# Patient Record
Sex: Female | Born: 1980 | Race: White | Hispanic: No | Marital: Married | Smoking: Never smoker
Health system: Southern US, Community
[De-identification: ages and names within clinical notes are randomized; demographics above are authoritative.]

## PROBLEM LIST (undated history)

## (undated) DIAGNOSIS — D649 Anemia, unspecified: Secondary | ICD-10-CM

## (undated) DIAGNOSIS — I82409 Acute embolism and thrombosis of unspecified deep veins of unspecified lower extremity: Secondary | ICD-10-CM

## (undated) DIAGNOSIS — D759 Disease of blood and blood-forming organs, unspecified: Secondary | ICD-10-CM

## (undated) DIAGNOSIS — D259 Leiomyoma of uterus, unspecified: Secondary | ICD-10-CM

## (undated) HISTORY — PX: ABDOMINAL HYSTERECTOMY: SHX81

## (undated) HISTORY — PX: MYOMECTOMY: SHX85

---

## 1998-08-14 ENCOUNTER — Other Ambulatory Visit: Admission: RE | Admit: 1998-08-14 | Discharge: 1998-08-14 | Payer: Self-pay | Admitting: Obstetrics and Gynecology

## 1999-12-25 ENCOUNTER — Other Ambulatory Visit: Admission: RE | Admit: 1999-12-25 | Discharge: 1999-12-25 | Payer: Self-pay | Admitting: *Deleted

## 2001-02-24 ENCOUNTER — Other Ambulatory Visit: Admission: RE | Admit: 2001-02-24 | Discharge: 2001-02-24 | Payer: Self-pay | Admitting: Obstetrics and Gynecology

## 2002-08-16 ENCOUNTER — Other Ambulatory Visit: Admission: RE | Admit: 2002-08-16 | Discharge: 2002-08-16 | Payer: Self-pay | Admitting: Obstetrics and Gynecology

## 2007-02-18 HISTORY — PX: UTERINE FIBROID SURGERY: SHX826

## 2007-06-24 ENCOUNTER — Observation Stay (HOSPITAL_COMMUNITY): Admission: AD | Admit: 2007-06-24 | Discharge: 2007-06-25 | Payer: Self-pay | Admitting: Obstetrics and Gynecology

## 2007-08-25 ENCOUNTER — Inpatient Hospital Stay (HOSPITAL_COMMUNITY): Admission: RE | Admit: 2007-08-25 | Discharge: 2007-08-27 | Payer: Self-pay | Admitting: *Deleted

## 2007-08-25 ENCOUNTER — Encounter (INDEPENDENT_AMBULATORY_CARE_PROVIDER_SITE_OTHER): Payer: Self-pay | Admitting: *Deleted

## 2010-03-10 ENCOUNTER — Encounter: Payer: Self-pay | Admitting: Obstetrics and Gynecology

## 2010-07-02 NOTE — Op Note (Signed)
NAMERUDI, KNIPPENBERG              ACCOUNT NO.:  [id number]   MEDICAL RECORD NO.:  [id number]          PATIENT TYPE:  INP   LOCATION:  9318                          FACILITY:  [facility]   PHYSICIAN:  Linden B. Earlene Plater, M.D.  DATE OF BIRTH:  1980/02/23   DATE OF PROCEDURE:  08/25/2007  DATE OF DISCHARGE:                               OPERATIVE REPORT   PREOPERATIVE DIAGNOSES:  1. Excessive menstrual bleeding.  2. Uterine fibroids.   POSTOPERATIVE DIAGNOSES:  1. Excessive menstrual bleeding.  2. Uterine fibroids.   PROCEDURE:  Abdominal myomectomy.   SURGEON:  Chester Holstein. Earlene Plater, MD   ASSISTANT:  Lendon Colonel, MD   ANESTHESIA:  General.   FINDINGS:  Fibroid uterus, 35 fibroids removed primarily intramural and  several submucosal, ranging from 1-5 cm in diameter.  Cumulative weight  of the fibroids was 813 g, normal-appearing tubes and ovaries.   SPECIMEN:  Fibroid as above to Pathology.   BLOOD LOSS:  1100 mL.   COMPLICATIONS:  None.   INDICATIONS:  The patient is with a history of extremely heavy menstrual  bleeding with associated anemia previously with a hemoglobin in the 4  range, having previously required transfusion.  She was given Lupron but  really did not have much improvement in terms of bleeding and presents  for myomectomy.  Preop MRI showed diffusely fibrotic uterus but no  concerning signs for sarcoma.  The patient was advised of the risks of  surgery, including infection, bleeding, and damage to the surrounding  organs.   PROCEDURE NOTE:  The patient was taken to the operating room.  General  anesthesia was obtained.  Prepped and draped in standard fashion.  Foley  catheter was inserted into the bladder.   A vertical incision was made from the umbilicus to the pubis and carried  sharply to the fascia.  The fascia was divided sharply.  Posterior  sheath and peritoneum were elevated and entered sharply.  Uterus was  elevated through the incision.  The findings  as outlined above.  Midline  in the fundal area was infiltrated with dilute Pitressin solution and  dissection was carried caudad towards the bladder flap but respecting  the bladder flap area.  The area was undermined and multiple fibroids  were removed as outlined above.   Several of the fibroids were submucosal and were removed with sharp and  blunt technique.  All dead spaces were reapproximated with interrupted 0  Vicryl sutures and the primary uterine incision was closed in layers  with 0 Vicryl.  Hemostasis was obtained.  Interceed was placed over the  primary incision.  The fascia was then closed with a running stitch of  double-stranded #1 PDS suture.  Skin was closed with staples.   Speculum was inserted and a 30-mL Foley was inserted into the uterine  cavity and inflated to hopefully reduce intrauterine synechia.  This  will be removed in several days later in the office.   The patient tolerated the procedure well with no complications.  She was  taken to recovery room in awake and stable condition.  Gerri Spore B. Earlene Plater, M.D.  Electronically Signed     WBD/MEDQ  D:  08/25/2007  T:  08/26/2007  Job:  [id number]

## 2010-07-05 NOTE — Discharge Summary (Signed)
[name], [name]              ACCOUNT NO.:  [id number]   MEDICAL RECORD NO.:  [id number]          PATIENT TYPE:  INP   LOCATION:  9318                          FACILITY:  [facility]   PHYSICIAN:  Lenoard Aden, M.D.DATE OF BIRTH:  [birth year]   DATE OF ADMISSION:  08/25/2007  DATE OF DISCHARGE:  08/27/2007                               DISCHARGE SUMMARY   ADMISSION DIAGNOSES:  Abnormal uterine bleeding and fibroids.   DISCHARGE DIAGNOSES:  Abnormal uterine bleeding and fibroids.   PROCEDURE:  Abdominal myomectomy and blood transfusion for anemia.   HISTORY OF PRESENT ILLNESS:  A 30 year old white female with a 20-week  fibroid uterus, associated anemia, with hemoglobin in the 4 range prior  to admission.  She was transfused and placed on hormonal management to  stop her bleeding in time for surgery.   HOSPITAL COURSE:  The patient underwent abdominal myomectomy of some 35  fibroids on the day of surgery with over 800 g of accumulative fibroid  weight.  She had substantial bleeding towards the end of the procedure  and had resultant anemia, which required additional transfusions  postoperatively, but otherwise, she did well.  She was discharged home  on the second postoperative day in satisfactory condition.   DISCHARGE INSTRUCTIONS:  Return to office and await postoperative staple  removal.  No heavy lifting for 6 weeks.  No driving for 2 weeks.   DISCHARGE MEDICATIONS:  1. Tylox 1-2 p.o. q.4h. p.r.n. pain.  2. Ferrous sulfate 325 mg p.o. b.i.d.   DISPOSITION AT DISCHARGE:  Satisfactory.      Lenoard Aden, M.D.  Electronically Signed     Lenoard Aden, M.D.  Electronically Signed    RJT/MEDQ  D:  09/17/2007  T:  09/18/2007  Job:  [id number]

## 2010-11-14 LAB — PREPARE RBC (CROSSMATCH)

## 2010-11-14 LAB — DIFFERENTIAL
Basophils Absolute: 0.1
Basophils Relative: 1
Eosinophils Relative: 3
Lymphocytes Relative: 38
Monocytes Absolute: 0.8

## 2010-11-14 LAB — CBC
HCT: 24.9 — ABNORMAL LOW
HCT: 26 — ABNORMAL LOW
HCT: 29 — ABNORMAL LOW
Hemoglobin: 5.3 — CL
Hemoglobin: 9.4 — ABNORMAL LOW
MCHC: 32.2
MCHC: 32.4
MCV: 90.4
MCV: 91.4
Platelets: 261
Platelets: 275
Platelets: 462 — ABNORMAL HIGH
RBC: 1.82 — ABNORMAL LOW
RDW: 15.8 — ABNORMAL HIGH
RDW: 15.8 — ABNORMAL HIGH
RDW: 16.2 — ABNORMAL HIGH
RDW: 16.7 — ABNORMAL HIGH
WBC: 10.8 — ABNORMAL HIGH
WBC: 12.2 — ABNORMAL HIGH

## 2010-11-14 LAB — TYPE AND SCREEN

## 2010-11-14 LAB — PREGNANCY, URINE: Preg Test, Ur: NEGATIVE

## 2015-01-03 ENCOUNTER — Other Ambulatory Visit (HOSPITAL_COMMUNITY): Payer: Self-pay | Admitting: Family Medicine

## 2015-01-03 ENCOUNTER — Ambulatory Visit (HOSPITAL_COMMUNITY)
Admission: RE | Admit: 2015-01-03 | Discharge: 2015-01-03 | Disposition: A | Discharge status: Home / Self Care | Payer: BLUE CROSS/BLUE SHIELD | Source: Ambulatory Visit | Attending: Family Medicine | Admitting: Family Medicine

## 2015-01-03 ENCOUNTER — Ambulatory Visit (HOSPITAL_COMMUNITY): Payer: Self-pay

## 2015-01-03 DIAGNOSIS — R52 Pain, unspecified: Secondary | ICD-10-CM

## 2015-01-03 DIAGNOSIS — M7989 Other specified soft tissue disorders: Secondary | ICD-10-CM | POA: Insufficient documentation

## 2015-01-03 NOTE — Progress Notes (Signed)
VASCULAR LAB PRELIMINARY  PRELIMINARY  PRELIMINARY  PRELIMINARY  Bilateral lower extremity venous duplex completed.    Preliminary report:  Bilateral:  No evidence of DVT, superficial thrombosis, or Baker's Cyst.  Sluggish flow noted bilaterally.  Veins are difficult to compress.  Cannot rule out proximal obstruction or extrinsic compression.    Aivah Putman, RVT 01/03/2015, 11:40 AM

## 2015-01-05 ENCOUNTER — Encounter (HOSPITAL_COMMUNITY): Payer: Self-pay

## 2015-01-05 ENCOUNTER — Emergency Department (HOSPITAL_COMMUNITY): Payer: BLUE CROSS/BLUE SHIELD

## 2015-01-05 ENCOUNTER — Inpatient Hospital Stay (HOSPITAL_COMMUNITY)
Admission: EM | Admit: 2015-01-05 | Discharge: 2015-01-07 | DRG: 301 | Disposition: A | Discharge status: Home / Self Care | Payer: BLUE CROSS/BLUE SHIELD | Attending: Internal Medicine | Admitting: Internal Medicine

## 2015-01-05 ENCOUNTER — Ambulatory Visit (HOSPITAL_BASED_OUTPATIENT_CLINIC_OR_DEPARTMENT_OTHER)
Admission: RE | Admit: 2015-01-05 | Discharge: 2015-01-05 | Disposition: A | Discharge status: Home / Self Care | Payer: BLUE CROSS/BLUE SHIELD | Source: Ambulatory Visit | Attending: Cardiology | Admitting: Cardiology

## 2015-01-05 ENCOUNTER — Other Ambulatory Visit (HOSPITAL_COMMUNITY): Payer: Self-pay | Admitting: Obstetrics

## 2015-01-05 DIAGNOSIS — I82411 Acute embolism and thrombosis of right femoral vein: Secondary | ICD-10-CM | POA: Diagnosis present

## 2015-01-05 DIAGNOSIS — I82431 Acute embolism and thrombosis of right popliteal vein: Secondary | ICD-10-CM | POA: Diagnosis present

## 2015-01-05 DIAGNOSIS — Z8049 Family history of malignant neoplasm of other genital organs: Secondary | ICD-10-CM

## 2015-01-05 DIAGNOSIS — I82811 Embolism and thrombosis of superficial veins of right lower extremities: Secondary | ICD-10-CM | POA: Diagnosis present

## 2015-01-05 DIAGNOSIS — Y92019 Unspecified place in single-family (private) house as the place of occurrence of the external cause: Secondary | ICD-10-CM

## 2015-01-05 DIAGNOSIS — R609 Edema, unspecified: Secondary | ICD-10-CM

## 2015-01-05 DIAGNOSIS — I82401 Acute embolism and thrombosis of unspecified deep veins of right lower extremity: Secondary | ICD-10-CM | POA: Diagnosis present

## 2015-01-05 DIAGNOSIS — F419 Anxiety disorder, unspecified: Secondary | ICD-10-CM | POA: Diagnosis present

## 2015-01-05 DIAGNOSIS — I82403 Acute embolism and thrombosis of unspecified deep veins of lower extremity, bilateral: Secondary | ICD-10-CM | POA: Diagnosis present

## 2015-01-05 DIAGNOSIS — D259 Leiomyoma of uterus, unspecified: Secondary | ICD-10-CM | POA: Diagnosis not present

## 2015-01-05 DIAGNOSIS — I82441 Acute embolism and thrombosis of right tibial vein: Principal | ICD-10-CM | POA: Diagnosis present

## 2015-01-05 DIAGNOSIS — I82409 Acute embolism and thrombosis of unspecified deep veins of unspecified lower extremity: Secondary | ICD-10-CM

## 2015-01-05 DIAGNOSIS — I82220 Acute embolism and thrombosis of inferior vena cava: Secondary | ICD-10-CM | POA: Diagnosis present

## 2015-01-05 DIAGNOSIS — T384X5A Adverse effect of oral contraceptives, initial encounter: Secondary | ICD-10-CM | POA: Diagnosis present

## 2015-01-05 HISTORY — DX: Leiomyoma of uterus, unspecified: D25.9

## 2015-01-05 LAB — CBC WITH DIFFERENTIAL/PLATELET
BASOS PCT: 0 %
Basophils Absolute: 0 10*3/uL (ref 0.0–0.1)
Eosinophils Absolute: 0.1 10*3/uL (ref 0.0–0.7)
Eosinophils Relative: 1 %
HEMATOCRIT: 40.8 % (ref 36.0–46.0)
HEMOGLOBIN: 13.5 g/dL (ref 12.0–15.0)
Lymphocytes Relative: 14 %
Lymphs Abs: 1.6 10*3/uL (ref 0.7–4.0)
MCH: 31.8 pg (ref 26.0–34.0)
MCHC: 33.1 g/dL (ref 30.0–36.0)
MCV: 96 fL (ref 78.0–100.0)
MONOS PCT: 9 %
Monocytes Absolute: 1 10*3/uL (ref 0.1–1.0)
NEUTROS ABS: 8.5 10*3/uL — AB (ref 1.7–7.7)
NEUTROS PCT: 76 %
Platelets: 247 10*3/uL (ref 150–400)
RBC: 4.25 MIL/uL (ref 3.87–5.11)
RDW: 13 % (ref 11.5–15.5)
WBC: 11.3 10*3/uL — ABNORMAL HIGH (ref 4.0–10.5)

## 2015-01-05 LAB — BASIC METABOLIC PANEL
ANION GAP: 12 (ref 5–15)
BUN: 6 mg/dL (ref 6–20)
CALCIUM: 9.3 mg/dL (ref 8.9–10.3)
CHLORIDE: 103 mmol/L (ref 101–111)
CO2: 21 mmol/L — AB (ref 22–32)
Creatinine, Ser: 0.67 mg/dL (ref 0.44–1.00)
GFR calc non Af Amer: >60 mL/min (ref >60)
Glucose, Bld: 110 mg/dL — ABNORMAL HIGH (ref 65–99)
Potassium: 3.6 mmol/L (ref 3.5–5.1)
Sodium: 136 mmol/L (ref 135–145)

## 2015-01-05 LAB — HEPARIN LEVEL (UNFRACTIONATED): Heparin Unfractionated: 0.13 IU/mL — ABNORMAL LOW (ref 0.30–0.70)

## 2015-01-05 MED ORDER — HEPARIN BOLUS VIA INFUSION
2000.0000 [IU] | Freq: Once | INTRAVENOUS | Status: AC
  Administered 2015-01-05: 2000 [IU] via INTRAVENOUS
  Filled 2015-01-05: qty 2000

## 2015-01-05 MED ORDER — ALUM & MAG HYDROXIDE-SIMETH 200-200-20 MG/5ML PO SUSP: 30.0000 mL | Freq: Four times a day (QID) | ORAL | Status: DC | PRN

## 2015-01-05 MED ORDER — ONDANSETRON HCL 4 MG PO TABS: 4.0000 mg | ORAL_TABLET | Freq: Four times a day (QID) | ORAL | Status: DC | PRN

## 2015-01-05 MED ORDER — ACETAMINOPHEN 650 MG RE SUPP: 650.0000 mg | Freq: Four times a day (QID) | RECTAL | Status: DC | PRN

## 2015-01-05 MED ORDER — HYDROCODONE-ACETAMINOPHEN 5-325 MG PO TABS
1.0000 | ORAL_TABLET | ORAL | Status: DC | PRN
  Administered 2015-01-06 (×2): 1 via ORAL
  Administered 2015-01-07: 2 via ORAL
  Filled 2015-01-05: qty 2
  Filled 2015-01-05 (×2): qty 1

## 2015-01-05 MED ORDER — HEPARIN (PORCINE) IN NACL 100-0.45 UNIT/ML-% IJ SOLN
1250.0000 [IU]/h | INTRAMUSCULAR | Status: DC
  Administered 2015-01-05: 1250 [IU]/h via INTRAVENOUS
  Filled 2015-01-05: qty 250

## 2015-01-05 MED ORDER — SODIUM CHLORIDE 0.9 % IV BOLUS (SEPSIS)
1000.0000 mL | Freq: Once | INTRAVENOUS | Status: AC
  Administered 2015-01-05: 1000 mL via INTRAVENOUS

## 2015-01-05 MED ORDER — SODIUM CHLORIDE 0.9 % IV SOLN: 250.0000 mL | INTRAVENOUS | Status: DC | PRN

## 2015-01-05 MED ORDER — SODIUM CHLORIDE 0.9 % IJ SOLN
3.0000 mL | Freq: Two times a day (BID) | INTRAMUSCULAR | Status: DC
  Administered 2015-01-06 – 2015-01-07 (×2): 3 mL via INTRAVENOUS

## 2015-01-05 MED ORDER — HEPARIN BOLUS VIA INFUSION
5000.0000 [IU] | Freq: Once | INTRAVENOUS | Status: AC
  Administered 2015-01-05: 5000 [IU] via INTRAVENOUS
  Filled 2015-01-05: qty 5000

## 2015-01-05 MED ORDER — HEPARIN (PORCINE) IN NACL 100-0.45 UNIT/ML-% IJ SOLN
1800.0000 [IU]/h | INTRAMUSCULAR | Status: DC
  Administered 2015-01-06: 1750 [IU]/h via INTRAVENOUS
  Filled 2015-01-05 (×4): qty 250

## 2015-01-05 MED ORDER — SODIUM CHLORIDE 0.9 % IV BOLUS (SEPSIS)
500.0000 mL | Freq: Once | INTRAVENOUS | Status: AC
  Administered 2015-01-05: 500 mL via INTRAVENOUS

## 2015-01-05 MED ORDER — SODIUM CHLORIDE 0.9 % IJ SOLN: 3.0000 mL | INTRAMUSCULAR | Status: DC | PRN

## 2015-01-05 MED ORDER — IOHEXOL 350 MG/ML SOLN
80.0000 mL | Freq: Once | INTRAVENOUS | Status: AC | PRN
  Administered 2015-01-05: 80 mL via INTRAVENOUS

## 2015-01-05 MED ORDER — ONDANSETRON HCL 4 MG/2ML IJ SOLN: 4.0000 mg | Freq: Four times a day (QID) | INTRAMUSCULAR | Status: DC | PRN

## 2015-01-05 MED ORDER — ZOLPIDEM TARTRATE 5 MG PO TABS
5.0000 mg | ORAL_TABLET | Freq: Every evening | ORAL | Status: DC | PRN
  Administered 2015-01-05 – 2015-01-06 (×2): 5 mg via ORAL
  Filled 2015-01-05 (×2): qty 1

## 2015-01-05 MED ORDER — ACETAMINOPHEN 325 MG PO TABS: 650.0000 mg | ORAL_TABLET | Freq: Four times a day (QID) | ORAL | Status: DC | PRN

## 2015-01-05 NOTE — Progress Notes (Signed)
ANTICOAGULATION CONSULT NOTE - Follow-Up Consult  Pharmacy Consult for heparin Indication: DVT  No Known Allergies  Patient Measurements: Height: 5\' 5"  (165.1 cm) Weight: 175 lb (79.379 kg) IBW/kg (Calculated) : 57 Heparin Dosing Weight: 73.7 kg  Vital Signs: Temp: 98.2 F (36.8 C) (11/18 2045) Temp Source: Oral (11/18 2045) BP: 132/85 mmHg (11/18 2045) Pulse Rate: 120 (11/18 2045)  Labs:  Recent Labs  01/05/15 1204 01/05/15 2159  HGB 13.5  --   HCT 40.8  --   PLT 247  --   HEPARINUNFRC  --  0.13*  CREATININE 0.67  --     Estimated Creatinine Clearance: 101.3 mL/min (by C-G formula based on Cr of 0.67).   Medical History: Past Medical History  Diagnosis Date  . Fibroid, uterine    Assessment: 34 yo f presenting to the ED on 11/18 with BLE edema and pain.  Pharmacy is consulted to dose heparin for a DVT. Patient is not on any anticoagulation at home. Hgb 13.5, plts 247.   Initial heparin level below   Goal of Therapy:  Heparin level 0.3-0.7 units/ml Monitor platelets by anticoagulation protocol: Yes   Plan: Give heparin 2000 units IV bolus x 1. Then increase heparin gtt to 1450 units/hr. Check heparin level 6 hrs after gtt increased. Daily HL, CBC Monitor for s/sx of bleeding  Uvaldo Rising, BCPS  Clinical Pharmacist Pager [phone]  01/05/2015 10:46 PM

## 2015-01-05 NOTE — H&P (Addendum)
[facility] History and Physical  [name] [id number] DOB: July 23, 1980 DOA: 01/05/2015   PCP: Stephens Shire., MD    Chief Complaint: swelling in leg  HPI: [name] is a 34 y.o. female with h/o fibroid uterus s/p myomectomy who is on oral contraceptives. She began to have swelling in both legs earlier this week and underwent a venous duplex which was negative. She was placed on HCTZ and has taken 1 tab thus far. She subsequently developed severe swelling of her right leg yesterday and therefore underwent another duplex which reveals an extensive DVT. She drives an hour each way to work daily and sits at a desk all day. No other h/o long car or plane rides recently. She has no pain in the leg and only feels pressure when she ambulates. CT chest is negative for PE.  She has been evaluated by IR and it has been recommended by Dr Jacqualyn Posey that she undergo a thrombectomy tomorrow. She is, therefore, being admitted to the hospitalist service.  In regards to her uterine fibroids, she has undergone a myomectomy but still requires OCP to prevent heavy menses. She was told to transition from Minastrin to Norethindrone by Dr Pamala Hurry [facility]) today with plans to have Lupron injections started this February.   ROS  General: The patient denies anorexia, fever, weight loss Cardiac: Denies chest pain, syncope, palpitations, pedal edema  Respiratory: Denies cough, shortness of breath, wheezing GI: Denies severe indigestion/heartburn, abdominal pain, nausea, vomiting, diarrhea and constipation GU: Denies hematuria, incontinence, dysuria  Musculoskeletal: has been having lower back pain recently Skin: Denies suspicious skin lesions Neurologic: Denies focal weakness or numbness, change in vision Psychiatry: Denies depression or anxiety. Hematologic:+ easy bruising  All other systems reviewed and found to be negative.  Past Medical History  Diagnosis Date  . Fibroid,  uterine     Past Surgical History  Procedure Laterality Date  . Uterine fibroid surgery      Social History: does not smoke, occasionally drinks alcohol, no illicit drug use Lives at home, works as a Insurance account manager    No Known Allergies  Family history:  No h/o blood clots or clotting disorders in family    Prior to Admission medications   Medication Sig Start Date End Date Taking? Authorizing Provider  hydrochlorothiazide (HYDRODIURIL) 12.5 MG tablet Take 12.5 mg by mouth daily. 01/03/15  Yes Historical Provider, MD  MINASTRIN 24 FE 1-20 MG-MCG(24) CHEW Chew 1 tablet by mouth daily. 10/24/14  Yes Historical Provider, MD  Multiple Vitamin (MULTIVITAMIN) tablet Take 1 tablet by mouth daily.   Yes Historical Provider, MD  Multiple Vitamins-Minerals (HAIR/SKIN/NAILS PO) Take 1 capsule by mouth daily.   Yes Historical Provider, MD  naproxen sodium (ANAPROX) 220 MG tablet Take 220 mg by mouth 2 (two) times daily as needed (pain).   Yes Historical Provider, MD  norethindrone (AYGESTIN) 5 MG tablet Take 5 mg by mouth daily. 01/03/15  Yes Historical Provider, MD  Omega-3 Fatty Acids (FISH OIL PO) Take 1 capsule by mouth daily.   Yes Historical Provider, MD     Physical Exam: Filed Vitals:   01/05/15 1553 01/05/15 1600 01/05/15 1630 01/05/15 1700  BP: 125/89 127/93 135/97 137/85  Pulse: 120 119 123 117  Temp:      TempSrc:      Resp: 14     Height:      Weight:      SpO2: 98% 99% 97% 96%     General: AAO  x 3, no acute distress HEENT: Normocephalic and Atraumatic, Mucous membranes pink                PERRLA; EOM intact; No scleral icterus,                 Nares: Patent, Oropharynx: Clear, Fair Dentition                 Neck: FROM, no cervical lymphadenopathy, thyromegaly, carotid bruit or JVD;  Breasts: deferred CHEST WALL: No tenderness  CHEST: Normal respiration, clear to auscultation bilaterally  HEART: Regular rate and rhythm; no murmurs rubs or gallops  BACK: No kyphosis  or scoliosis; no CVA tenderness  GI: Positive Bowel Sounds, soft, non-tender; no masses, no organomegaly Rectal Exam: deferred MSK: No cyanosis, clubbing- severe edema and erythema of right leg and foot extending to just above the knee Genitalia: not examined  SKIN:  no rash or ulceration  CNS: Alert and Oriented x 4, Nonfocal exam, CN 2-12 intact  Labs on Admission:  Basic Metabolic Panel:  Recent Labs Lab 01/05/15 1204  NA 136  K 3.6  CL 103  CO2 21*  GLUCOSE 110*  BUN 6  CREATININE 0.67  CALCIUM 9.3   Liver Function Tests: No results for input(s): AST, ALT, ALKPHOS, BILITOT, PROT, ALBUMIN in the last 168 hours. No results for input(s): LIPASE, AMYLASE in the last 168 hours. No results for input(s): AMMONIA in the last 168 hours. CBC:  Recent Labs Lab 01/05/15 1204  WBC 11.3*  NEUTROABS 8.5*  HGB 13.5  HCT 40.8  MCV 96.0  PLT 247   Cardiac Enzymes: No results for input(s): CKTOTAL, CKMB, CKMBINDEX, TROPONINI in the last 168 hours.  BNP (last 3 results) No results for input(s): BNP in the last 8760 hours.  ProBNP (last 3 results) No results for input(s): PROBNP in the last 8760 hours.  CBG: No results for input(s): GLUCAP in the last 168 hours.  Radiological Exams on Admission: Ct Angio Chest Pe W/cm &/or Wo Cm  01/05/2015  CLINICAL DATA:  Ultrasound today showed DVT in right leg, swollen right leg, no chest pain no shortness of breath, tachycardia. EXAM: CT ANGIOGRAPHY CHEST WITH CONTRAST TECHNIQUE: Multidetector CT imaging of the chest was performed using the standard protocol during bolus administration of intravenous contrast. Multiplanar CT image reconstructions and MIPs were obtained to evaluate the vascular anatomy. CONTRAST:  19mL OMNIPAQUE IOHEXOL 350 MG/ML SOLN COMPARISON:  None. FINDINGS: Mediastinum/Nodes: The quality of this exam for evaluation of pulmonary embolism is moderate. The bolus is mildly suboptimally timed, with contrast centered in the  SVC. No pulmonary embolism to the segmental level. Normal caliber of the aorta and branch vessels. Heart size accentuated by a mild pectus excavatum deformity. No pericardial effusion. No mediastinal or hilar adenopathy. Lungs/Pleura: No pleural fluid. Minimal motion degradation. Mosaic attenuation throughout both lungs is mild and favored to related to areas of subsegmental atelectasis. No lobar consolidation. Upper abdomen: Normal imaged portions of the liver, spleen, stomach, pancreas, gallbladder, adrenal glands. Musculoskeletal: No acute osseous abnormality. Review of the MIP images confirms the above findings. IMPRESSION: No evidence of pulmonary embolism. Mild limitations as detailed above. Electronically Signed   By: Abigail Miyamoto M.D.   On: 01/05/2015 13:43      Assessment/Plan Active Problems:   Acute DVT (deep venous thrombosis), right - likely secondary to OCP, long drive to work and relatively sedentary job- blood work ordered to look for genetic and hereditary causes for hypercoagulable  states- I subsequently discussed the case with  Dr Pamala Hurry who states that the size of her uterus is preventing adequate venous return and contributing to a low flow state in her lower extermities - will undergo thrombectomy and IVC filter placement tomorrow- cont Heparin for today and keep leg elevated - d/c HCTZ    Fibroid uterus - I have had an discussion with Dr Pamala Hurry in regards to a plan to prevent extensive withdrawal bleeding from stopping her hormones in setting of anticoagulation- she is considering doing a hysterectomy     Consulted: Gyn, IR Code Status: full code Family Communication:   DVT Prophylaxis: heparin infusion  Time spent: 74 min  Sarita, MD [facility]  If 7PM-7AM, please contact night-coverage [email or web address] 01/05/2015, 5:14 PM

## 2015-01-05 NOTE — ED Notes (Signed)
Attempted to call report. Floor RN unable to accept report.  

## 2015-01-05 NOTE — ED Notes (Signed)
Paged IR/Jamie Earleen Newport to [employer]

## 2015-01-05 NOTE — Consult Note (Signed)
CC: [name] and fibroid uterus  HPI: 34 yo G0 with known fibroid uterus seen in consultation at the request of Dr. Wynelle Cleveland regarding management of fibroids in the setting of new [name]. [name] with LE swelling that has been increasing over the past few weeks, seen 2 days ago, LE dopplers negative for [name] and at that time it was felt that large fibroid uterus was impacting lower extremity venous return and that ultimate cure would be for hysterectomy. As [name] was not ready for hysterectomy and wanted surgical management in several months time, plan was to proceed with GnRH agonist, Lupron in attempt to stop uterine bleeding and help decrease uterine/ fibroid size for easier surgical management and decreases possible IVC compression. [name] was instructed to stop COCs and instead switch to progesterone only hormonal suppression.   This am [name] noticed acute increase in RLE swelling and presented to ER for further eval. Large RLE [name] noted.   [name] notes left leg swelling somewhat improved.   [name] has been having uterine spotting off and on for the past several month despite continuous birth control pills. U/s 11/16 showing uterus 29x24x14 cm with estimated volume 3520g, inability to see endometrium or adnexa due to large uterine size.   PMH: fibroid uterus PSH: X-lap/ myomectomy NKDA Meds: COC until this am, HCTZ x 1 dose FH: GF with "vascular disease from blood clots, needing amputation"        GM w/ papillary serous endometrial cancer  PE:  Filed Vitals:   01/05/15 1730 01/05/15 1745 01/05/15 1809 01/05/15 2045  BP: 124/90  128/101 132/85  Pulse: 118  120 120  Temp:  98 F (36.7 C) 99 F (37.2 C) 98.2 F (36.8 C)  TempSrc:  Oral Oral Oral  Resp:   18 18  Height:      Weight:      SpO2: 97%  98% 98%   Gen: anxious and tearful at time, no acute distress Abd: entire abdomen and pelvis filled with large fibroid uterus, nodularity felt c/w fibroid, limited mobility, mass extends to diaphragm Skin: healed  vertical midline incision.  GU: def LE: RLE with edema, pain, skin tension. Skin warm and dry  CBC    Component Value Date/Time   WBC 11.3* 01/05/2015 1204   RBC 4.25 01/05/2015 1204   HGB 13.5 01/05/2015 1204   HCT 40.8 01/05/2015 1204   PLT 247 01/05/2015 1204   MCV 96.0 01/05/2015 1204   MCH 31.8 01/05/2015 1204   MCHC 33.1 01/05/2015 1204   RDW 13.0 01/05/2015 1204   LYMPHSABS 1.6 01/05/2015 1204   MONOABS 1.0 01/05/2015 1204   EOSABS 0.1 01/05/2015 1204   BASOSABS 0.0 01/05/2015 1204    LE dopplers: large R [name], sluggish flow on L but no [name]  CT angio- no PE  A/P 34 yo G0 with large uterine fibroids and RLE [name].  - [name] with extensive clot burden. Plan for thrombectomy with VIR tomorrow, per Dr. Earleen Newport this may need to be a 2stage procedure with TPA lysis overnight between stage. Plan for stage 1 tomorrow. Risks of further embolization with procedure and IVC filter to be placed. [name] to continue heparin drip. Given active [name], [name] has stopped estrogen containing contraceptive. Risk factors for [name] are long drive, estrogen use, and large fibroid uterus possibly compressing IVC.   - Fibroid uterus. [name] has finally agreed to the need for hysterectomy. This nulliparous [name] understands fertility implications of hysterectomy. Would plan ovarian retention. It remains unclear the  optimal timing for her hysterectomy. Planning a major surgery with prolonged operative time and immobility during recovery carries risks of continued venous thromboembolism, especially in the setting of current [name]. In addition, operating during active anticoagulation for [name] carries bleeding risk. [name] is aware of these risks and accepting of blood transfusion and clotting factors if needed. [name] aware of operative risks overall. Delaying hysterectomy until clot burden decreased/ no longer with active [name] carries risk of repeat [name] formation as etiology of [name], IVC compression from fibroid, remains an active medical issue.  In addition, [name] is at risk of excessive uterine bleeding during anticoagulation, especially if patient is to stop OCPs which has been reducing menstrual bleeding. Progesterone only uterine suppression, while safer than estrogen, is still a relative contraindication in the setting of active [name]. A potential alternative to immediate surgery, would be Depo Lupron, continual GnRH agonist to decrease uterine bleeding and uterine size while awaiting resolution of [name] and allowing surgery to be done when there would not be a continued need for therapeutic anticoagulation. Will see how [name] repsonds to thrombectomy, have hematology weigh in on R/B of immediate vs delayed surgery in terms of thrombosis risk and watch for uterine bleeding during heparin and possibly TPA administration. OK to stay off all hormonal medications at this time. Will reassess the possibility of starting aygestin (progesterone) after thrombectomy.  Pasquale Matters A. 01/05/2015 10:46 PM   Cell: [id number]  60 minutes spent with [name] and family and in coordinating care with Dr. Earleen Newport and Dr. Wynelle Cleveland.

## 2015-01-05 NOTE — ED Notes (Signed)
Pt presents with 1 week h/o R lower back pain and BLE edema and pain.  Pt reports pain began to back (denies pain there at present).  Pt seen for doppler on Wednesday, reported "slow blood flow".  Pt denies any shortness of breath or chest pain.

## 2015-01-05 NOTE — ED Provider Notes (Signed)
CSN: [id number]     Arrival date & time 01/05/15  1059 History   First MD Initiated Contact with Patient 01/05/15 1108     Chief Complaint  Patient presents with  . Leg Pain   HPI   [name] is a 34 y.o. F PMH uterine fibroids presenting with a one week history of right leg swelling and pain. She states her pain began in her right lower back, and then her right leg became swollen and painful. She was seen for a doppler on Wednesday, was told she had "slow blood flow" and was placed on HCTZ. Yesterday, her right leg became more swollen and painful, so she presents today from Vascular lab because her ultrasound revealed an extensive clot. She endorses a long-standing history of oral estrogen BCPs but denies any CP, SOB, smoking history, cancer history, recent surgeries, family history of blood clots. She denies fevers, chills, pain elsewhere. Her back pain has since resolved.    Past Medical History  Diagnosis Date  . Fibroid, uterine    Past Surgical History  Procedure Laterality Date  . Uterine fibroid surgery     History reviewed. No pertinent family history. Social History  Substance Use Topics  . Smoking status: Never Smoker   . Smokeless tobacco: None  . Alcohol Use: None   OB History    No data available     Review of Systems  Ten systems are reviewed and are negative for acute change except as noted in the HPI   Allergies  Review of patient's allergies indicates no known allergies.  Home Medications   Prior to Admission medications   Medication Sig Start Date End Date Taking? Authorizing Provider  Multiple Vitamin (MULTIVITAMIN) tablet Take 1 tablet by mouth daily.   Yes Historical Provider, MD  Multiple Vitamins-Minerals (HAIR/SKIN/NAILS PO) Take 1 capsule by mouth daily.   Yes Historical Provider, MD  norethindrone (AYGESTIN) 5 MG tablet Take 5 mg by mouth daily. 01/03/15  Yes Historical Provider, MD  Omega-3 Fatty Acids (FISH OIL PO) Take 1 capsule by  mouth daily.   Yes Historical Provider, MD  enoxaparin (LOVENOX) 80 MG/0.8ML injection Inject 0.8 mLs (80 mg total) into the skin every 12 (twelve) hours. 01/07/15   Debbe Odea, MD  HYDROcodone-acetaminophen (NORCO/VICODIN) 5-325 MG tablet Take 1 tablet by mouth every 6 (six) hours as needed for moderate pain. 01/07/15   Debbe Odea, MD   BP 125/69 mmHg  Pulse 124  Temp(Src) 99 F (37.2 C) (Oral)  Resp 23  Ht _0  (1.651 m)  Wt 79.379 kg  BMI 29.12 kg/m2  SpO2 97%  LMP 12/29/2014 Physical Exam  Constitutional: She appears well-developed and well-nourished. No distress.  HENT:  Head: Normocephalic and atraumatic.  Mouth/Throat: Oropharynx is clear and moist. No oropharyngeal exudate.  Eyes: Conjunctivae are normal. Pupils are equal, round, and reactive to light. Right eye exhibits no discharge. Left eye exhibits no discharge. No scleral icterus.  Neck: No tracheal deviation present.  Cardiovascular: Regular rhythm, normal heart sounds and intact distal pulses.  Exam reveals no gallop and no friction rub.   No murmur heard. Tachycardic  Pulmonary/Chest: Effort normal and breath sounds normal. No respiratory distress. She has no wheezes. She has no rales. She exhibits no tenderness.  Abdominal: Soft. Bowel sounds are normal. She exhibits no distension and no mass. There is no tenderness. There is no rebound and no guarding.  Musculoskeletal: Normal range of motion. She exhibits edema and tenderness.  Right  leg edematous and tender to palpation diffusely. Pulses detected on doppler. Patient neurovascularly intact bilaterally.   Lymphadenopathy:    She has no cervical adenopathy.  Neurological: She is alert. Coordination normal.  Skin: Skin is warm and dry. No rash noted. She is not diaphoretic. No erythema.  Psychiatric: She has a normal mood and affect. Her behavior is normal.  Anxious appearing  Nursing note and vitals reviewed.   ED Course  Procedures (including critical care  time) Labs Review  Basic metabolic panel (Final result)Abnormal Component (Lab Inquiry)   Collection Time Result Time NA K CL CO2 GLUCOSE  01/05/15 12:04:00 01/05/15 12:38:34 136 3.6 103 21 (L) 110 (H)    Collection Time Result Time BUN Creatinine, Ser CALCIUM GFR calc non Af Amer GFR calc Af Amer  01/05/15 12:04:00 01/05/15 12:38:34 6 0.67 9.3 >60  >60    (Comment)    (NOTE)  The eGFR has been calculated using the CKD EPI equation.  This calculation has not been validated in all clinical situations.  eGFR's persistently <60 mL/min signify possible Chronic Kidney  Disease.          Collection Time Result Time Anion gap  01/05/15 12:04:00 01/05/15 12:38:34 12             CBC with Differential (Final result)Abnormal Component (Lab Inquiry)    Collection Time Result Time WBC RBC HGB HCT MCV   01/05/15 12:04:00 01/05/15 12:14:18 11.3 (H) 4.25 13.5 40.8 96.0      Collection Time Result Time MCH MCHC RDW PLT NEUTRO PCT   01/05/15 12:04:00 01/05/15 12:14:18 31.8 33.1 13.0 247 76      Collection Time Result Time AB NEUTRO LYMPHO PCT AB LYM MONO PCT MONO ABS   01/05/15 12:04:00 01/05/15 12:14:18 8.5 (H) 14 1.6 9 1.0      Collection Time Result Time EOS PCT EOSINO ABS BASOS PCT BASOS ABS   01/05/15 12:04:00 01/05/15 12:14:18 1 0.1 0 0.0       Imaging Review CT Angio Chest PE W/Cm &/Or Wo Cm (Final result) Result time: 01/05/15 13:43:56   Final result by Rad Results In Interface (01/05/15 13:43:56)   Narrative:   CLINICAL DATA: Ultrasound today showed DVT in right leg, swollen right leg, no chest pain no shortness of breath, tachycardia.  EXAM: CT ANGIOGRAPHY CHEST WITH CONTRAST  TECHNIQUE: Multidetector CT imaging of the chest was performed using the standard protocol during bolus administration of intravenous contrast. Multiplanar CT image reconstructions and MIPs were obtained to evaluate the vascular anatomy.  CONTRAST: 68m  OMNIPAQUE IOHEXOL 350 MG/ML SOLN  COMPARISON: None.  FINDINGS: Mediastinum/Nodes: The quality of this exam for evaluation of pulmonary embolism is moderate. The bolus is mildly suboptimally timed, with contrast centered in the SVC. No pulmonary embolism to the segmental level.  Normal caliber of the aorta and branch vessels. Heart size accentuated by a mild pectus excavatum deformity. No pericardial effusion. No mediastinal or hilar adenopathy.  Lungs/Pleura: No pleural fluid. Minimal motion degradation. Mosaic attenuation throughout both lungs is mild and favored to related to areas of subsegmental atelectasis. No lobar consolidation.  Upper abdomen: Normal imaged portions of the liver, spleen, stomach, pancreas, gallbladder, adrenal glands.  Musculoskeletal: No acute osseous abnormality.  Review of the MIP images confirms the above findings.  IMPRESSION: No evidence of pulmonary embolism. Mild limitations as detailed above.   Electronically Signed By: KAbigail MiyamotoM.D. On: 01/05/2015 13:43    I have personally reviewed and evaluated these images and  lab results as part of my medical decision-making.   EKG Interpretation None      MDM   Final diagnoses:  DVT (deep venous thrombosis) (HCC)   Patient with tachycardia and anxiety.   Dr. Reather Converse to see patient, as swelling is extensive and no official report was posted at time of evaluation. He advises vascular consult, CTA chest, and basic labs.   Vascular agrees to admit patient instead of outpatient therapy.   Millerton Lions, PA-C 01/08/15 2110  Elnora Morrison, MD 01/11/15 [phone]

## 2015-01-05 NOTE — ED Notes (Signed)
Vascular/IR at bedside

## 2015-01-05 NOTE — Consult Note (Signed)
Chief Complaint: Right leg swelling.   Referring Physician(s): Dr. Reather Converse  History of Present Illness: [name] is a 34 y.o. female presenting to the emergency room today with extensive right-sided leg swelling.  Interventional radiology has been consult regarding her right lower extremity swelling, and evidence on her duplex exam of the DVT.  She reports to me that she was having some lower back and hip pain earlier in the week, and a prior duplex performed on Wednesday was negative for DVT. She has no prior history of DVT or pulmonary embolism. She has no family members that she knows of with a history of lower extremity DVT.  She does know that she has a large fibroid burden of her uterus with enlarged size, which she has been treating with medical therapy with her OB/GYN. There are plans in the future for hysterectomy, confirmed by her surgeon.  She denies any fevers writers or chills. She denies any shortness of breath. She denies any sensory changes of her lower extremities, with these new symptoms limited to the right leg.  She states that she has had bilateral swelling in her legs for some time, although she has not given it much consideration. She has no heart history of congestive heart failure or MI. The only medicine that she has been taking his oral contraceptives.  Past Medical History  Diagnosis Date  . Fibroid, uterine     Past Surgical History  Procedure Laterality Date  . Uterine fibroid surgery      Allergies: Review of patient's allergies indicates no known allergies.  Medications: Prior to Admission medications   Medication Sig Start Date End Date Taking? Authorizing Provider  hydrochlorothiazide (HYDRODIURIL) 12.5 MG tablet Take 12.5 mg by mouth daily. 01/03/15  Yes Historical Provider, MD  MINASTRIN 24 FE 1-20 MG-MCG(24) CHEW Chew 1 tablet by mouth daily. 10/24/14  Yes Historical Provider, MD  Multiple Vitamin (MULTIVITAMIN) tablet Take 1  tablet by mouth daily.   Yes Historical Provider, MD  Multiple Vitamins-Minerals (HAIR/SKIN/NAILS PO) Take 1 capsule by mouth daily.   Yes Historical Provider, MD  naproxen sodium (ANAPROX) 220 MG tablet Take 220 mg by mouth 2 (two) times daily as needed (pain).   Yes Historical Provider, MD  norethindrone (AYGESTIN) 5 MG tablet Take 5 mg by mouth daily. 01/03/15  Yes Historical Provider, MD  Omega-3 Fatty Acids (FISH OIL PO) Take 1 capsule by mouth daily.   Yes Historical Provider, MD     History reviewed. No pertinent family history.  Social History   Social History  . Marital Status: Married    Spouse Name: N/A  . Number of Children: N/A  . Years of Education: N/A   Social History Main Topics  . Smoking status: Never Smoker   . Smokeless tobacco: None  . Alcohol Use: None  . Drug Use: None  . Sexual Activity: Not Asked   Other Topics Concern  . None   Social History Narrative  . None      Review of Systems: A 12 point ROS discussed and pertinent positives are indicated in the HPI above.  All other systems are negative.  Review of Systems  Vital Signs: BP 128/101 mmHg  Pulse 120  Temp(Src) 99 F (37.2 C) (Oral)  Resp 18  Ht 5\' 5"  (1.651 m)  Wt 175 lb (79.379 kg)  BMI 29.12 kg/m2  SpO2 98%  LMP 12/29/2014  Physical Exam  Atraumatic normocephalic. Mucous membranes moist and pink. Conjugate gaze. No  scleral icterus or scleral injection. Moving the cervical region well with no restriction. No adenopathy. Clear to auscultation bilaterally with no wheezes rhonchi or rails. Regular rate and rhythm. No third heart sound appreciated. Protuberant abdomen. Nontender with no rigidity. Genitourinary deferred. Alert and oriented to person place and time. Appropriate affect and questions. Significant asymmetry of the right lower extremity compared to the left. Edema of the right greater than left lower extremity with taut skin. No ulceration or open wounds. No  varicosities or telangiectasias. No chronic venous stasis changes. No dermatitis. Significantly increased warmth of the right leg to touch with diffuse erythema.  No sensory deficits of the feet, with symmetric sensation. Symmetric motion maintained in the feet.  Mallampati Score:  2  Imaging: Ct Angio Chest Pe W/cm &/or Wo Cm  01/05/2015  CLINICAL DATA:  Ultrasound today showed DVT in right leg, swollen right leg, no chest pain no shortness of breath, tachycardia. EXAM: CT ANGIOGRAPHY CHEST WITH CONTRAST TECHNIQUE: Multidetector CT imaging of the chest was performed using the standard protocol during bolus administration of intravenous contrast. Multiplanar CT image reconstructions and MIPs were obtained to evaluate the vascular anatomy. CONTRAST:  66mL OMNIPAQUE IOHEXOL 350 MG/ML SOLN COMPARISON:  None. FINDINGS: Mediastinum/Nodes: The quality of this exam for evaluation of pulmonary embolism is moderate. The bolus is mildly suboptimally timed, with contrast centered in the SVC. No pulmonary embolism to the segmental level. Normal caliber of the aorta and branch vessels. Heart size accentuated by a mild pectus excavatum deformity. No pericardial effusion. No mediastinal or hilar adenopathy. Lungs/Pleura: No pleural fluid. Minimal motion degradation. Mosaic attenuation throughout both lungs is mild and favored to related to areas of subsegmental atelectasis. No lobar consolidation. Upper abdomen: Normal imaged portions of the liver, spleen, stomach, pancreas, gallbladder, adrenal glands. Musculoskeletal: No acute osseous abnormality. Review of the MIP images confirms the above findings. IMPRESSION: No evidence of pulmonary embolism. Mild limitations as detailed above. Electronically Signed   By: Abigail Miyamoto M.D.   On: 01/05/2015 13:43    Labs:  CBC:  Recent Labs  01/05/15 1204  WBC 11.3*  HGB 13.5  HCT 40.8  PLT 247    COAGS: No results for input(s): INR, APTT in the last 8760  hours.  BMP:  Recent Labs  01/05/15 1204  NA 136  K 3.6  CL 103  CO2 21*  GLUCOSE 110*  BUN 6  CALCIUM 9.3  CREATININE 0.67  GFRNONAA >60  GFRAA >60    LIVER FUNCTION TESTS: No results for input(s): BILITOT, AST, ALT, ALKPHOS, PROT, ALBUMIN in the last 8760 hours.  TUMOR MARKERS: No results for input(s): AFPTM, CEA, CA199, CHROMGRNA in the last 8760 hours.  Assessment and Plan:  34 year old female with new onset right lower extremity acute DVT, with significant symptoms of swelling, pain, redness/erythema.  DVT burden involves the common femoral vein, femoral vein, popliteal vein, and tibial veins.    She has high risk of developing post-thrombotic syndrome, and I discussed with her and her husband the implications.  I also discussed with her the standard of care treatment, which is anti-coagulation, and catheter directed lysis/thrombectomy.  I did discuss risks including bleeding, infection, contrast reaction, kidney injury, vein injury, need for further procedure/surgery, embolism/PE, cardiopulmonary collapse, death. I also discussed a prophylactic IVC filter, and the associated risks of fracture and migration. I also discussed with her the potential need to place lysis catheters for tPA infusion.    I agree with the current  management with heparin/anti-coagulation.    We will plan on venogram and thrombolysis/thrombectomy on Saturday, November 19th.    I have discussed her case with her OB/GYN, and her IM/hospitalist.    Thank you for this interesting consult.  I greatly enjoyed meeting [name] and look forward to participating in their care.  A copy of this report was sent to the requesting provider on this date.  SignedCorrie Mckusick 01/05/2015, 6:11 PM   I spent a total of 40 Minutes    in face to face in clinical consultation, greater than 50% of which was counseling/coordinating care for DVT, possible thrombolysis, possible IVC filter placement.

## 2015-01-05 NOTE — ED Notes (Signed)
REpaged Scot Dock

## 2015-01-05 NOTE — Progress Notes (Signed)
ANTICOAGULATION CONSULT NOTE - Initial Consult  Pharmacy Consult for heparin Indication: DVT  No Known Allergies  Patient Measurements: Height: 5\' 5"  (165.1 cm) Weight: 175 lb (79.379 kg) IBW/kg (Calculated) : 57 Heparin Dosing Weight: 73.7 kg  Vital Signs: Temp: 98.9 F (37.2 C) (11/18 1118) Temp Source: Oral (11/18 1118) BP: 127/92 mmHg (11/18 1333) Pulse Rate: 116 (11/18 1333)  Labs:  Recent Labs  01/05/15 1204  HGB 13.5  HCT 40.8  PLT 247  CREATININE 0.67    Estimated Creatinine Clearance: 101.3 mL/min (by C-G formula based on Cr of 0.67).   Medical History: Past Medical History  Diagnosis Date  . Fibroid, uterine    Assessment: 34 yo f presenting to the ED on 11/18 with BLE edema and pain.  Pharmacy is consulted to dose heparin for a DVT. Patient is not on any anticoagulation at home. Hgb 13.5, plts 247.   Goal of Therapy:  Heparin level 0.3-0.7 units/ml Monitor platelets by anticoagulation protocol: Yes   Plan: Heparin 5,000 unit bolus x 1 Heparin infusion 1,250 units/ml 6-hr HL @ 2130 Daily HL, CBC Monitor for s/sx of bleeding  Avryl Roehm L. Nicole Kindred, PharmD Clinical Pharmacy Resident Pager: [phone] 01/05/2015 3:26 PM

## 2015-01-05 NOTE — Progress Notes (Addendum)
Preliminary results by tech - Bilateral Lower Ext. Venous Duplex Completed. Extensive acute deep vein thrombosis involving the right lower extremity from the common femoral vein into the calf veins. Left Lower Extremity, negative for deep and superficial vein thrombosis. Results given to [facility] at Dr. Jerrilyn Cairo office. Patient was sent to the ER. Oda Cogan, BS, RDMS, RVT

## 2015-01-06 ENCOUNTER — Inpatient Hospital Stay (HOSPITAL_COMMUNITY): Payer: BLUE CROSS/BLUE SHIELD

## 2015-01-06 ENCOUNTER — Encounter (HOSPITAL_COMMUNITY): Payer: Self-pay | Admitting: Radiology

## 2015-01-06 LAB — CBC
HEMATOCRIT: 35.2 % — AB (ref 36.0–46.0)
Hemoglobin: 11.8 g/dL — ABNORMAL LOW (ref 12.0–15.0)
MCH: 32.3 pg (ref 26.0–34.0)
MCHC: 33.5 g/dL (ref 30.0–36.0)
MCV: 96.4 fL (ref 78.0–100.0)
Platelets: 220 10*3/uL (ref 150–400)
RBC: 3.65 MIL/uL — ABNORMAL LOW (ref 3.87–5.11)
RDW: 13 % (ref 11.5–15.5)
WBC: 10.1 10*3/uL (ref 4.0–10.5)

## 2015-01-06 LAB — HEPARIN LEVEL (UNFRACTIONATED)
HEPARIN UNFRACTIONATED: 0.11 [IU]/mL — AB (ref 0.30–0.70)
HEPARIN UNFRACTIONATED: 0.32 [IU]/mL (ref 0.30–0.70)

## 2015-01-06 MED ORDER — LIDOCAINE HCL 1 % IJ SOLN
INTRAMUSCULAR | Status: AC
  Filled 2015-01-06: qty 20

## 2015-01-06 MED ORDER — HEPARIN BOLUS VIA INFUSION
2000.0000 [IU] | Freq: Once | INTRAVENOUS | Status: AC
  Administered 2015-01-06: 2000 [IU] via INTRAVENOUS
  Filled 2015-01-06: qty 2000

## 2015-01-06 MED ORDER — FENTANYL CITRATE (PF) 100 MCG/2ML IJ SOLN
INTRAMUSCULAR | Status: AC
  Filled 2015-01-06: qty 4

## 2015-01-06 MED ORDER — NORETHINDRONE ACETATE 5 MG PO TABS
5.0000 mg | ORAL_TABLET | Freq: Every day | ORAL | Status: DC
  Administered 2015-01-06 – 2015-01-07 (×2): 5 mg via ORAL
  Filled 2015-01-06 (×2): qty 1

## 2015-01-06 MED ORDER — SODIUM CHLORIDE 0.9 % IV SOLN
INTRAVENOUS | Status: DC
  Administered 2015-01-06: 09:00:00 via INTRAVENOUS

## 2015-01-06 MED ORDER — MIDAZOLAM HCL 2 MG/2ML IJ SOLN
INTRAMUSCULAR | Status: AC
  Filled 2015-01-06: qty 4

## 2015-01-06 MED ORDER — MIDAZOLAM HCL 2 MG/2ML IJ SOLN
INTRAMUSCULAR | Status: AC | PRN
  Administered 2015-01-06 (×2): 1 mg via INTRAVENOUS
  Administered 2015-01-06: 0.5 mg via INTRAVENOUS

## 2015-01-06 MED ORDER — FENTANYL CITRATE (PF) 100 MCG/2ML IJ SOLN
INTRAMUSCULAR | Status: AC | PRN
  Administered 2015-01-06 (×2): 50 ug via INTRAVENOUS
  Administered 2015-01-06: 25 ug via INTRAVENOUS

## 2015-01-06 MED ORDER — IOHEXOL 300 MG/ML  SOLN
200.0000 mL | Freq: Once | INTRAMUSCULAR | Status: DC | PRN
  Administered 2015-01-06: 100 mL via INTRAVENOUS
  Filled 2015-01-06: qty 200

## 2015-01-06 MED ORDER — SODIUM CHLORIDE 0.9 % IV SOLN
INTRAVENOUS | Status: DC
  Filled 2015-01-06: qty 20

## 2015-01-06 MED ORDER — ENOXAPARIN SODIUM 80 MG/0.8ML ~~LOC~~ SOLN
80.0000 mg | Freq: Two times a day (BID) | SUBCUTANEOUS | Status: DC
  Administered 2015-01-06 – 2015-01-07 (×2): 80 mg via SUBCUTANEOUS
  Filled 2015-01-06 (×2): qty 0.8

## 2015-01-06 MED ORDER — LEUPROLIDE ACETATE 3.75 MG IM KIT
3.7500 mg | PACK | Freq: Once | INTRAMUSCULAR | Status: AC
  Administered 2015-01-06: 3.75 mg via INTRAMUSCULAR
  Filled 2015-01-06: qty 3.75

## 2015-01-06 NOTE — Progress Notes (Signed)
HD#2, fibroid uterus, RLE DVT   S: Pt notes minimal uterine bleeding, no cramping. R leg still swollen and tender, no chest pain/ SOB  O: Filed Vitals:   01/05/15 1730 01/05/15 1745 01/05/15 1809 01/05/15 2045  BP: 124/90  128/101 132/85  Pulse: 118  120 120  Temp:  98 F (36.7 C) 99 F (37.2 C) 98.2 F (36.8 C)  TempSrc:  Oral Oral Oral  Resp:   18 18  Height:      Weight:      SpO2: 97%  98% 98%   Gen: no distress Abd: uterine fibroid filling abdomen and pelvis, non-tender GU: minimal staining on pad LE: R LE tender, swollen, warm  A/P: RLE DVT in setting of large fibroid.   - DVT. Heparin gtt, planning thrombectomy today with VIR. Planning 3 months anticoagulation. Large fibroid uterus likely impacting venous return and remains a risk factor for recurrent DVT and hysterectomy will be needed. R/B of immediate vs delayed hysterectomy d/w hematology and my gynecology partners. Major surgery in the setting of current DVT is a significant risk factor for recurrent thrombosis. After discussing with hematology, thrombosis risk will be significantly lower with a delayed procedure. Will reduce thrombosis risk until definitive surgical management with stopping OCP, avoid prolonged immobility (hour drive to work), anticoagulation and attempts to decrease uterine size. Will contact VIR to ensure IVC filter can remain in place until hysterectomy. If new or expanding DVT seen, may need to proceed with surgery sooner.   - Fibroid uterus. Hysterectomy in 4-8 wks to minimize thrombosis risk from surgery in setting of current DVT.  Expect bleeding from uterus with anticoagulation. To minimize bleeding and help decrease uterine size, will plan Depot Lupron. Plan the 1 month shot/ 3.75mg  and will repeat in 4 wks in gyn office. Given risk of bleeding 2 wks after Lupron, will give daily aygestin, 5 mg, for the first 2 wks. While both of these hormonal medications increase risk of thrombosis, given active  anticoagulation, benefits outweigh risk. Hematology in agreement.  In unable to control uterine bleeding, may need to proceed with hysterectomy sooner than planned.  35 minutes spent on floor and with patient explaining care plan and coordinating plan.  Brexton Sofia A. 01/06/2015 11:06 AM  [phone]

## 2015-01-06 NOTE — Sedation Documentation (Signed)
Patient denies pain and is resting comfortably.  

## 2015-01-06 NOTE — Progress Notes (Addendum)
ANTICOAGULATION CONSULT NOTE - Follow-Up Consult  Pharmacy Consult for heparin Indication: DVT  No Known Allergies  Patient Measurements: Height: 5\' 5"  (165.1 cm) Weight: 175 lb (79.379 kg) IBW/kg (Calculated) : 57 Heparin Dosing Weight: 73.7 kg  Vital Signs: BP: 126/83 mmHg (11/19 1216) Pulse Rate: 120 (11/19 1216)  Labs:  Recent Labs  01/05/15 1204 01/05/15 2159 01/06/15 0430 01/06/15 1057  HGB 13.5  --  11.8*  --   HCT 40.8  --  35.2*  --   PLT 247  --  220  --   HEPARINUNFRC  --  0.13* 0.11* 0.32  CREATININE 0.67  --   --   --     Estimated Creatinine Clearance: 101.3 mL/min (by C-G formula based on Cr of 0.67).   Assessment: 34 yo f presenting to the ED on 11/18 with BLE edema and pain and was placed on heparin for RLE DVT. Heparin level is now therapeutic at 0.32 on heparin 1750 units/hr. Pt is currently in IR undergoing thrombolysis.   Goal of Therapy:  Heparin level 0.3-0.7 units/ml Monitor platelets by anticoagulation protocol: Yes   Plan: - F/u post-IR to determine plan for anticoagulation  Salome Arnt, PharmD, BCPS Pager # [phone] 01/06/2015 12:17 PM   Addendum: Pt did not receive thrombolysis and heparin was not stopped in IR. Since last heparin level was on the lower end of the goal range, will increase slightly to 1800 units/hr and check an 8 hour heparin level.  Salome Arnt, PharmD, BCPS Pager # [phone] 01/06/2015 2:00 PM

## 2015-01-06 NOTE — Progress Notes (Signed)
[facility] Progress Note   [name]  [id number]  DOB: September 12, 1980  DOA: 01/05/2015 PCP: Stephens Shire., MD  Brief narrative: [name] is a 34 y.o. female with h/o fibroid uterus s/p myomectomy who is on oral contraceptives. She began to have swelling in both legs earlier this week and underwent a venous duplex which was negative. She was placed on HCTZ and has taken 1 tab thus far. She subsequently developed severe swelling of her right leg and therefore underwent another venous duplex which reveals an extensive DVT. She was admitted for a thrombectomy and anticoagulation.    Subjective: Swelling slightly better. No pain in leg.   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 of cathter- thrombus extends up to renal veins - after multiple conversations with Dr Pamala Hurry, Dr Beryle Beams, Dr Julien Nordmann and Dr Earleen Newport is has been decided that the best option for her at this time is anticoagulation for 4-6 wks prior to attempting hysterectomy - will switch Heparin to Lovenox (discussed with Dr Beryle Beams)  - Dr Pamala Hurry has recommended a dose of Lupron and daily Aygestin- she has spoken with Dr Everitt Amber ([place]) and will plan on performing a hysterectomy with her in the upcoming weeks - the patient has been having minor vaginal bleeding for the past couple of weeks which is unchanged on IV Heparin     Code Status:     Code Status Orders        Start     Ordered   01/05/15 1707  Full code   Continuous     01/05/15 1707     Family Communication:  Disposition Plan: home tomorrow DVT prophylaxis: Lovenox Consultants:Gyn, IR, phone consult with Hematology Procedures: venous duplex   Antibiotics: Anti-infectives    None       Objective: Filed Weights   01/05/15 1118  Weight: 79.379 kg (175 lb)    Intake/Output Summary (Last 24 hours) at 01/06/15 1554 Last data filed at 01/06/15 [id number]  Gross per 24 hour  Intake 1675.63 ml  Output    450 ml  Net 1225.63 ml     Vitals Filed Vitals:   01/06/15 1315 01/06/15 1320 01/06/15 1339 01/06/15 1436  BP: 114/67 114/77 122/84 124/87  Pulse: 115 118 120 121  Temp:    98.5 F (36.9 C)  TempSrc:    Oral  Resp: 22 25 24 25   Height:      Weight:      SpO2: 97% 99% 98% 97%    Exam:  General:  Pt is alert, not in acute distress  HEENT: No icterus, No thrush, oral mucosa moist  Cardiovascular: regular rate and rhythm, S1/S2 No murmur  Respiratory: clear to auscultation bilaterally   Abdomen: Soft, +Bowel sounds, non tender, + distended, no guarding  MSK: No  cyanosis or clubbing- + 2 right lower extremity edema and erythema extending up to thigh  Data Reviewed: Basic Metabolic Panel:  Recent Labs Lab 01/05/15 1204  NA 136  K 3.6  CL 103  CO2 21*  GLUCOSE 110*  BUN 6  CREATININE 0.67  CALCIUM 9.3   Liver Function Tests: No results for input(s): AST, ALT, ALKPHOS, BILITOT, PROT, ALBUMIN in the last 168 hours. No results for input(s): LIPASE, AMYLASE in the last 168 hours.  No results for input(s): AMMONIA in the last 168 hours. CBC:  Recent Labs Lab 01/05/15 1204 01/06/15 0430  WBC 11.3* 10.1  NEUTROABS 8.5*  --   HGB 13.5 11.8*  HCT 40.8 35.2*  MCV 96.0 96.4  PLT 247 220   Cardiac Enzymes: No results for input(s): CKTOTAL, CKMB, CKMBINDEX, TROPONINI in the last 168 hours. BNP (last 3 results) No results for input(s): BNP in the last 8760 hours.  ProBNP (last 3 results) No results for input(s): PROBNP in the last 8760 hours.  CBG: No results for input(s): GLUCAP in the last 168 hours.  No results found for this or any previous visit (from the past 240 hour(s)).   Studies: Ct Angio Chest Pe W/cm &/or Wo Cm  01/05/2015   CLINICAL DATA:  Ultrasound today showed DVT in right leg, swollen right leg, no chest pain no shortness of breath, tachycardia. EXAM: CT ANGIOGRAPHY CHEST WITH CONTRAST TECHNIQUE: Multidetector CT imaging of the chest was performed using the standard protocol during bolus administration of intravenous contrast. Multiplanar CT image reconstructions and MIPs were obtained to evaluate the vascular anatomy. CONTRAST:  40mL OMNIPAQUE IOHEXOL 350 MG/ML SOLN COMPARISON:  None. FINDINGS: Mediastinum/Nodes: The quality of this exam for evaluation of pulmonary embolism is moderate. The bolus is mildly suboptimally timed, with contrast centered in the SVC. No pulmonary embolism to the segmental level. Normal caliber of the aorta and branch vessels. Heart size accentuated by a mild pectus excavatum deformity. No pericardial effusion. No mediastinal or hilar adenopathy. Lungs/Pleura: No pleural fluid. Minimal motion degradation. Mosaic attenuation throughout both lungs is mild and favored to related to areas of subsegmental atelectasis. No lobar consolidation. Upper abdomen: Normal imaged portions of the liver, spleen, stomach, pancreas, gallbladder, adrenal glands. Musculoskeletal: No acute osseous abnormality. Review of the MIP images confirms the above findings. IMPRESSION: No evidence of pulmonary embolism. Mild limitations as detailed above. Electronically Signed   By: Abigail Miyamoto M.D.   On: 01/05/2015 13:43    Scheduled Meds:  Scheduled Meds: . enoxaparin (LOVENOX) injection  80 mg Subcutaneous Q12H  . fentaNYL      . lidocaine      . midazolam      . sodium chloride  3 mL Intravenous Q12H   Continuous Infusions:    Time spent on care of this patient: 50 min   [place], MD 01/06/2015, 3:54 PM  LOS: 1 day   [facility] Office  [phone] Pager - Text Page per [email or web address] If 7PM-7AM, please contact night-coverage [email or web address]

## 2015-01-06 NOTE — Progress Notes (Signed)
ANTICOAGULATION CONSULT NOTE - Follow Up Consult  Pharmacy Consult for heparin Indication: DVT   Labs:  Recent Labs  01/05/15 1204 01/05/15 2159 01/06/15 0430  HGB 13.5  --  11.8*  HCT 40.8  --  35.2*  PLT 247  --  220  HEPARINUNFRC  --  0.13* 0.11*  CREATININE 0.67  --   --     Assessment: 34yo female remains subtherapeutic on heparin with lower level despite increased rate; Hgb down but no outward signs of bleeding.  Goal of Therapy:  Heparin level 0.3-0.7 units/ml   Plan:  Will rebolus with heparin 2000 units and increase gtt by 4 units/kg/hr to 1750 units/hr and check level in [place], PharmD, BCPS  01/06/2015,5:06 AM

## 2015-01-06 NOTE — Procedures (Signed)
Interventional Radiology Procedure Note  Procedure: right IJ access for IVC cavogram.    Findings:  Tight stenosis of the infra-renal IVC, with difficulty passing catheter to distal IVC.  Soft tissue mass within central abdomen displaces the bowel gas, with known fibroid uterus.   IVC thrombus to the level of the renal veins.  Supra-renal IVC and hepatic IVC are patent.   No lysis or thrombectomy performed.  Intra-op findings dicussed with Dr. Ronita Hipps.      Complications: None Recommendations:  - Ok to shower tomorrow - Do not submerge for 7 days - Routine wound care   Signed,  Dulcy Fanny. Earleen Newport, DO

## 2015-01-06 NOTE — Progress Notes (Signed)
Pharmacy: Heparin ---> Lovenox  34yof currently on IV heparin for her RLE DVT now to switch to therapeutic lovenox. Weight = 79kg. Renal function wnl.  Plan: 1) Stop IV heparin 2) ~ 1 hour after heparin stopped, begin lovenox 80mg  sq q12 (1 mg/kg q12) 3) CBC q72h  Nena Jordan, PharmD, BCPS 01/06/2015, 3:23 PM

## 2015-01-07 LAB — CBC
HEMATOCRIT: 35.8 % — AB (ref 36.0–46.0)
Hemoglobin: 11.6 g/dL — ABNORMAL LOW (ref 12.0–15.0)
MCH: 31.2 pg (ref 26.0–34.0)
MCHC: 32.4 g/dL (ref 30.0–36.0)
MCV: 96.2 fL (ref 78.0–100.0)
PLATELETS: 233 10*3/uL (ref 150–400)
RBC: 3.72 MIL/uL — AB (ref 3.87–5.11)
RDW: 13 % (ref 11.5–15.5)
WBC: 8.5 10*3/uL (ref 4.0–10.5)

## 2015-01-07 LAB — HOMOCYSTEINE: Homocysteine: 7.1 umol/L (ref 0.0–15.0)

## 2015-01-07 MED ORDER — ENOXAPARIN SODIUM 80 MG/0.8ML ~~LOC~~ SOLN: 80.0000 mg | Freq: Two times a day (BID) | SUBCUTANEOUS | Status: DC

## 2015-01-07 MED ORDER — HYDROCODONE-ACETAMINOPHEN 5-325 MG PO TABS: 1.0000 | ORAL_TABLET | Freq: Four times a day (QID) | ORAL | Status: DC | PRN

## 2015-01-07 NOTE — Discharge Summary (Signed)
Physician Discharge Summary  SHERILYNN Greer [id number] DOB: December 25, 1980 DOA: 01/05/2015  PCP: Stephens Shire., MD  Admit date: 01/05/2015 Discharge date: 01/07/2015  Time spent: 55 minutes     Discharge Condition: stable    Discharge Diagnoses:  Principal Problem:   Acute DVT (deep venous thrombosis), right Active Problems:   Fibroid uterus   History of present illness:  [name] is a 34 y.o. female with h/o fibroid uterus s/p myomectomy who is on oral contraceptives. She began to have swelling in both legs earlier this week and underwent a venous duplex which was negative. She was placed on HCTZ and has taken 1 tab thus far. She subsequently developed severe swelling of her right leg and therefore underwent another venous duplex only 2 days after the first which reveals an extensive right leg DVT. She was admitted for a thrombectomy and anticoagulation.    Hospital Course:  Principal Problem:  Acute extensive right sided lower extremity/ pelvic DVT (deep venous thrombosis) secondary to large fibroid uterus and oral contraceptive use - severe swelling of right leg on admission extending up to abdomen - thrombectomy and IVC filter attempted but stenosis of infrarenal IVC did not allow passage of cathter- thrombus noted to be extending up to renal veins - after multiple conversations with Dr Pamala Hurry, Dr Beryle Beams, Dr Julien Nordmann and Dr Earleen Newport is has been decided that the best option for her at this time is anticoagulation for 4-6 wks prior to attempting hysterectomy - will switch Heparin to Lovenox as recommended by Dr Beryle Beams  - Dr Pamala Hurry has recommended a dose of Lupron and daily Aygestin- she has spoken with Dr Everitt Amber ([place]) and will plan on performing a hysterectomy with her in the upcoming weeks - the patient has been having minor vaginal bleeding for the past couple of weeks which is unchanged on IV Heparin   Procedures:  Lower extremity  venous duplex 11/18 Findings consistent with acute deep vein thrombosis involving the  right common femoral vein, right profunda femoris vein, right  femoral vein, right popliteal vein, right posterior tibial veins,  and right peroneal veins. - Findings consistent with also superficial vein thrombosis  involving the right greater saphenous vein. - No evidence of deep vein thrombosis involving the left lower  extremity. However, sluggish flow was noted throughout the left  venous system.   Consultations:  Gyn, IR, hematology  Discharge Exam: Filed Weights   01/05/15 1118  Weight: 79.379 kg (175 lb)   Filed Vitals:   01/07/15 0502  BP: 125/69  Pulse: 124  Temp: 99 F (37.2 C)  Resp: 23    General: AAO x 3, no distress Cardiovascular: RRR, no murmurs  Respiratory: clear to auscultation bilaterally GI: soft, non-tender, non-distended, bowel sound positive  Discharge Instructions You were cared for by a hospitalist during your hospital stay. If you have any questions about your discharge medications or the care you received while you were in the hospital after you are discharged, you can call the unit and asked to speak with the hospitalist on call if the hospitalist that took care of you is not available. Once you are discharged, your primary care physician will handle any further medical issues. Please note that NO REFILLS for any discharge medications will be authorized once you are discharged, as it is imperative that you return to your primary care physician (or establish a relationship with a primary care physician if you do not have one) for your aftercare  needs so that they can reassess your need for medications and monitor your lab values.  Discharge Instructions    Diet - low sodium heart healthy    Complete by:  As directed      Increase activity slowly    Complete by:  As directed             Medication List    STOP taking these medications         hydrochlorothiazide 12.5 MG tablet  Commonly known as:  HYDRODIURIL     MINASTRIN 24 FE 1-20 MG-MCG(24) Chew  Generic drug:  Norethin Ace-Eth Estrad-FE     naproxen sodium 220 MG tablet  Commonly known as:  ANAPROX      TAKE these medications        enoxaparin 80 MG/0.8ML injection  Commonly known as:  LOVENOX  Inject 0.8 mLs (80 mg total) into the skin every 12 (twelve) hours.     FISH OIL PO  Take 1 capsule by mouth daily.     HAIR/SKIN/NAILS PO  Take 1 capsule by mouth daily.     HYDROcodone-acetaminophen 5-325 MG tablet  Commonly known as:  NORCO/VICODIN  Take 1 tablet by mouth every 6 (six) hours as needed for moderate pain.     multivitamin tablet  Take 1 tablet by mouth daily.     norethindrone 5 MG tablet  Commonly known as:  AYGESTIN  Take 5 mg by mouth daily.       No Known Allergies     Follow-up Information    Follow up with Annia Belt, MD.   Specialty:  Oncology   Why:  As needed, If symptoms worsen   Contact information:   [place] [address] [phone]        The results of significant diagnostics from this hospitalization (including imaging, microbiology, ancillary and laboratory) are listed below for reference.    Significant Diagnostic Studies: Ct Angio Chest Pe W/cm &/or Wo Cm  01/05/2015  CLINICAL DATA:  Ultrasound today showed DVT in right leg, swollen right leg, no chest pain no shortness of breath, tachycardia. EXAM: CT ANGIOGRAPHY CHEST WITH CONTRAST TECHNIQUE: Multidetector CT imaging of the chest was performed using the standard protocol during bolus administration of intravenous contrast. Multiplanar CT image reconstructions and MIPs were obtained to evaluate the vascular anatomy. CONTRAST:  73mL OMNIPAQUE IOHEXOL 350 MG/ML SOLN COMPARISON:  None. FINDINGS: Mediastinum/Nodes: The quality of this exam for evaluation of pulmonary embolism is moderate. The bolus is mildly suboptimally timed, with contrast  centered in the SVC. No pulmonary embolism to the segmental level. Normal caliber of the aorta and branch vessels. Heart size accentuated by a mild pectus excavatum deformity. No pericardial effusion. No mediastinal or hilar adenopathy. Lungs/Pleura: No pleural fluid. Minimal motion degradation. Mosaic attenuation throughout both lungs is mild and favored to related to areas of subsegmental atelectasis. No lobar consolidation. Upper abdomen: Normal imaged portions of the liver, spleen, stomach, pancreas, gallbladder, adrenal glands. Musculoskeletal: No acute osseous abnormality. Review of the MIP images confirms the above findings. IMPRESSION: No evidence of pulmonary embolism. Mild limitations as detailed above. Electronically Signed   By: Abigail Miyamoto M.D.   On: 01/05/2015 13:43   Ir Mariana Arn Ivc  01/06/2015  CLINICAL DATA:  34 year old female with a history of new onset right lower extremity swelling, with duplex exam demonstrating extensive right lower extremity DVT. Patient has a known history of fibroid uterus, which was the presumed cause  of caval compression and leading to lower extremity DVT. Given her symptoms, and her young age, she has a high risk for future post thrombotic syndrome and is a good candidate for thrombectomy/ thrombolysis of the lower extremity DVT. She presents for initiation of thrombectomy/ thrombolysis, with a prophylactic IVC filter. EXAM: 1. ULTRASOUND GUIDANCE FOR VASCULAR ACCESS OF THE RIGHT INTERNAL JUGULAR VEIN. 2. IVC VENOGRAM. ANESTHESIA/SEDATION: 3 mg Versed, 150 mcg fentanyl MEDICATIONS: No addition CONTRAST:  142mL OMNIPAQUE IOHEXOL 300 MG/ML  SOLN PROCEDURE: The procedure, risks, benefits, and alternatives were explained to the patient and the patient's husband. Questions regarding the procedure were encouraged and answered. The patient understands and consents to the procedure. The neck was prepped with Betadine in a sterile fashion, and a sterile drape was applied  covering the operative field. A sterile gown and sterile gloves were used for the procedure. Local anesthesia was provided with 1% Lidocaine. Ultrasound survey was performed with images stored and sent to PACs. Once the patient is prepped and draped in the usual sterile fashion, the skin and subcutaneous tissues overlying the right internal jugular vein were generously infiltrated 1% lidocaine for local anesthesia. Micro puncture was then advanced into the internal jugular vein. Micro wire was advanced into the IJ vein, small incision was made with 11 blade scalpel. Sheath was placed over the micro wire, and then an 035 wire was passed through the dilator into the IVC. Serial dilation of the neck soft tissues performed with a dilator. During placement of the IVC filter device into the abdomen, resistance was encountered. A 7 French sheath was then placed at the neck and an angled catheter was advanced into the IVC. With some resistance, a Glidewire and catheter combination were passed into the distal IVC. A limited venogram was performed demonstrating extensive thrombus of the iliac veins, and the infrarenal IVC to the level of the renal veins. A pigtail catheter was then placed into the suprarenal IVC for a limited venogram demonstrating patent suprarenal IVC and hepatic IVC. Catheters and wires were removed. Manual pressure was held at the right neck. Patient tolerated the procedure well and remained hemodynamically stable throughout. No complications were encountered and no significant blood loss was encounter COMPLICATIONS: None FINDINGS: Initial plain film images demonstrate soft tissue mass of the abdomen, compatible with the patient's known fibroid uterus. Small bowel and colon are displaced superiorly. Limited venogram of the IVC demonstrates extensive thrombus and compression of the infrarenal IVC, with only a small flow channel. No left common iliac vein was identified. The supra renal IVC and hepatic IVC  are patent. No IVC filter was deployed. IMPRESSION: Status post right IJ puncture and IVC cavagram, identifying extensive infrarenal IVC compression and thrombus, extending from the iliac veins. No IVC filter was placed. Soft tissue mass of the abdomen, most likely related to the patient's known fibroid uterus. Signed, Dulcy Fanny. Earleen Newport, DO [facility] Electronically Signed   By: Corrie Mckusick D.O.   On: 01/06/2015 15:54   Ir US Guide Vasc Access Right  01/06/2015  CLINICAL DATA:  34 year old female with a history of new onset right lower extremity swelling, with duplex exam demonstrating extensive right lower extremity DVT. Patient has a known history of fibroid uterus, which was the presumed cause of caval compression and leading to lower extremity DVT. Given her symptoms, and her young age, she has a high risk for future post thrombotic syndrome and is a good candidate for thrombectomy/ thrombolysis of  the lower extremity DVT. She presents for initiation of thrombectomy/ thrombolysis, with a prophylactic IVC filter. EXAM: 1. ULTRASOUND GUIDANCE FOR VASCULAR ACCESS OF THE RIGHT INTERNAL JUGULAR VEIN. 2. IVC VENOGRAM. ANESTHESIA/SEDATION: 3 mg Versed, 150 mcg fentanyl MEDICATIONS: No addition CONTRAST:  182mL OMNIPAQUE IOHEXOL 300 MG/ML  SOLN PROCEDURE: The procedure, risks, benefits, and alternatives were explained to the patient and the patient's husband. Questions regarding the procedure were encouraged and answered. The patient understands and consents to the procedure. The neck was prepped with Betadine in a sterile fashion, and a sterile drape was applied covering the operative field. A sterile gown and sterile gloves were used for the procedure. Local anesthesia was provided with 1% Lidocaine. Ultrasound survey was performed with images stored and sent to PACs. Once the patient is prepped and draped in the usual sterile fashion, the skin and  subcutaneous tissues overlying the right internal jugular vein were generously infiltrated 1% lidocaine for local anesthesia. Micro puncture was then advanced into the internal jugular vein. Micro wire was advanced into the IJ vein, small incision was made with 11 blade scalpel. Sheath was placed over the micro wire, and then an 035 wire was passed through the dilator into the IVC. Serial dilation of the neck soft tissues performed with a dilator. During placement of the IVC filter device into the abdomen, resistance was encountered. A 7 French sheath was then placed at the neck and an angled catheter was advanced into the IVC. With some resistance, a Glidewire and catheter combination were passed into the distal IVC. A limited venogram was performed demonstrating extensive thrombus of the iliac veins, and the infrarenal IVC to the level of the renal veins. A pigtail catheter was then placed into the suprarenal IVC for a limited venogram demonstrating patent suprarenal IVC and hepatic IVC. Catheters and wires were removed. Manual pressure was held at the right neck. Patient tolerated the procedure well and remained hemodynamically stable throughout. No complications were encountered and no significant blood loss was encounter COMPLICATIONS: None FINDINGS: Initial plain film images demonstrate soft tissue mass of the abdomen, compatible with the patient's known fibroid uterus. Small bowel and colon are displaced superiorly. Limited venogram of the IVC demonstrates extensive thrombus and compression of the infrarenal IVC, with only a small flow channel. No left common iliac vein was identified. The supra renal IVC and hepatic IVC are patent. No IVC filter was deployed. IMPRESSION: Status post right IJ puncture and IVC cavagram, identifying extensive infrarenal IVC compression and thrombus, extending from the iliac veins. No IVC filter was placed. Soft tissue mass of the abdomen, most likely related to the patient's  known fibroid uterus. Signed, Dulcy Fanny. Earleen Newport, DO [facility] Electronically Signed   By: Corrie Mckusick D.O.   On: 01/06/2015 15:54    Microbiology: No results found for this or any previous visit (from the past 240 hour(s)).   Labs: Basic Metabolic Panel:  Recent Labs Lab 01/05/15 1204  NA 136  K 3.6  CL 103  CO2 21*  GLUCOSE 110*  BUN 6  CREATININE 0.67  CALCIUM 9.3   Liver Function Tests: No results for input(s): AST, ALT, ALKPHOS, BILITOT, PROT, ALBUMIN in the last 168 hours. No results for input(s): LIPASE, AMYLASE in the last 168 hours. No results for input(s): AMMONIA in the last 168 hours. CBC:  Recent Labs Lab 01/05/15 1204 01/06/15 0430 01/07/15 0351  WBC 11.3* 10.1 8.5  NEUTROABS 8.5*  --   --  HGB 13.5 11.8* 11.6*  HCT 40.8 35.2* 35.8*  MCV 96.0 96.4 96.2  PLT 247 220 233   Cardiac Enzymes: No results for input(s): CKTOTAL, CKMB, CKMBINDEX, TROPONINI in the last 168 hours. BNP: BNP (last 3 results) No results for input(s): BNP in the last 8760 hours.  ProBNP (last 3 results) No results for input(s): PROBNP in the last 8760 hours.  CBG: No results for input(s): GLUCAP in the last 168 hours.     SignedDebbe Odea, MD [facility] 01/07/2015, 10:29 AM

## 2015-01-08 ENCOUNTER — Other Ambulatory Visit: Payer: Self-pay | Admitting: Obstetrics

## 2015-01-09 LAB — BETA-2-GLYCOPROTEIN I ABS, IGG/M/A

## 2015-01-10 ENCOUNTER — Ambulatory Visit (INDEPENDENT_AMBULATORY_CARE_PROVIDER_SITE_OTHER): Payer: BLUE CROSS/BLUE SHIELD | Admitting: Internal Medicine

## 2015-01-10 ENCOUNTER — Encounter: Payer: Self-pay | Admitting: Internal Medicine

## 2015-01-10 VITALS — BP 141/89 | HR 115 | Temp 98.2°F | Wt 172.2 lb

## 2015-01-10 DIAGNOSIS — I82401 Acute embolism and thrombosis of unspecified deep veins of right lower extremity: Secondary | ICD-10-CM | POA: Diagnosis not present

## 2015-01-10 LAB — ANTIPHOSPHOLIPID SYNDROME EVAL, BLD
Anticardiolipin IgA: <9 APL U/mL (ref 0–11)
Anticardiolipin IgG: <9 GPL U/mL (ref 0–14)
DRVVT: 55.2 s — ABNORMAL HIGH (ref 0.0–44.0)
PHOSPHATYDALSERINE, IGM: 12 {MPS'U} (ref 0–25)
PTT Lupus Anticoagulant: 88.3 s — ABNORMAL HIGH (ref 0.0–40.6)
Phosphatydalserine, IgA: <1 APS IgA (ref 0–20)
Phosphatydalserine, IgG: 2 GPS IgG (ref 0–11)

## 2015-01-10 LAB — PROTHROMBIN GENE MUTATION

## 2015-01-10 LAB — DRVVT MIX: dRVVT Mix: 46.9 s — ABNORMAL HIGH (ref 0.0–44.0)

## 2015-01-10 LAB — PTT-LA MIX: PTT-LA Mix: 75.7 s — ABNORMAL HIGH (ref 0.0–40.6)

## 2015-01-10 LAB — DRVVT CONFIRM: dRVVT Confirm: 1.2 ratio (ref 0.8–1.2)

## 2015-01-10 LAB — HEXAGONAL PHASE PHOSPHOLIPID: HEXAGONAL PHASE PHOSPHOLIPID: 12 s — AB (ref 0–11)

## 2015-01-10 MED ORDER — ENOXAPARIN SODIUM 80 MG/0.8ML ~~LOC~~ SOLN: 80.0000 mg | Freq: Two times a day (BID) | SUBCUTANEOUS | Status: DC

## 2015-01-10 MED ORDER — ZOLPIDEM TARTRATE ER 12.5 MG PO TBCR: 12.5000 mg | EXTENDED_RELEASE_TABLET | Freq: Every evening | ORAL | Status: DC | PRN

## 2015-01-10 NOTE — Patient Instructions (Signed)
Thank you for coming in today  Deep Vein Thrombosis A deep vein thrombosis (DVT) is a blood clot (thrombus) that usually occurs in a deep, larger vein of the lower leg or the pelvis, or in an upper extremity such as the arm. These are dangerous and can lead to serious and even life-threatening complications if the clot travels to the lungs. A DVT can damage the valves in your leg veins so that instead of flowing upward, the blood pools in the lower leg. This is called post-thrombotic syndrome, and it can result in pain, swelling, discoloration, and sores on the leg. CAUSES A DVT is caused by the formation of a blood clot in your leg, pelvis, or arm. Usually, several things contribute to the formation of blood clots. A clot may develop when:  Your blood flow slows down.  Your vein becomes damaged in some way.  You have a condition that makes your blood clot more easily. RISK FACTORS A DVT is more likely to develop in:  People who are older, especially over 64 years of age.  People who are overweight (obese).  People who sit or lie still for a long time, such as during long-distance travel (over 4 hours), bed rest, hospitalization, or during recovery from certain medical conditions like a stroke.  People who do not engage in much physical activity (sedentary lifestyle).  People who have chronic breathing disorders.  People who have a personal or family history of blood clots or blood clotting disease.  People who have peripheral vascular disease (PVD), diabetes, or some types of cancer.  People who have heart disease, especially if the person had a recent heart attack or has congestive heart failure.  People who have neurological diseases that affect the legs (leg paresis).  People who have had a traumatic injury, such as breaking a hip or leg.  People who have recently had major or lengthy surgery, especially on the hip, knee, or abdomen.  People who have had a central line  placed inside a large vein.  People who take medicines that contain the hormone estrogen. These include birth control pills and hormone replacement therapy.  Pregnancy or during childbirth or the postpartum period.  Long plane flights (over 8 hours). SIGNS AND SYMPTOMS Symptoms of a DVT can include:   Swelling of your leg or arm, especially if one side is much worse.  Warmth and redness of your leg or arm, especially if one side is much worse.  Pain in your arm or leg. If the clot is in your leg, symptoms may be more noticeable or worse when you stand or walk.  A feeling of pins and needles, if the clot is in the arm. The symptoms of a DVT that has traveled to the lungs (pulmonary embolism, PE) usually start suddenly and include:  Shortness of breath while active or at rest.  Coughing or coughing up blood or blood-tinged mucus.  Chest pain that is often worse with deep breaths.  Rapid or irregular heartbeat.  Feeling light-headed or dizzy.  Fainting.  Feeling anxious.  Sweating. There may also be pain and swelling in a leg if that is where the blood clot started. These symptoms may represent a serious problem that is an emergency. Do not wait to see if the symptoms will go away. Get medical help right away. Call your local emergency services (911 in the [place] Do not drive yourself to the hospital. DIAGNOSIS Your health care provider will take a medical history and  perform a physical exam. You may also have other tests, including:  Blood tests to assess the clotting properties of your blood.  Imaging tests, such as CT, ultrasound, MRI, X-ray, and other tests to see if you have clots anywhere in your body. TREATMENT After a DVT is identified, it can be treated. The type of treatment that you receive depends on many factors, such as the cause of your DVT, your risk for bleeding or developing more clots, and other medical conditions that you have. Sometimes, a combination of  treatments is necessary. Treatment options may be combined and include:  Monitoring the blood clot with ultrasound.  Taking medicines by mouth, such as newer blood thinners (anticoagulants), thrombolytics, or warfarin.  Taking anticoagulant medicine by injection or through an IV tube.  Wearing compression stockings or using different types ofdevices.  Surgery (rare) to remove the blood clot or to place a filter in your abdomen to stop the blood clot from traveling to your lungs. Treatments for a DVT are often divided into immediate treatment and long-term treatment (up to 3 months after DVT). You can work with your health care provider to choose the treatment program that is best for you. HOME CARE INSTRUCTIONS If you are taking a newer oral anticoagulant:  Take the medicine every single day at the same time each day.  Understand what foods and drugs interact with this medicine.  Understand that there are no regular blood tests required when using this medicine.  Understand the side effects of this medicine, including excessive bruising or bleeding. Ask your health care provider or pharmacist about other possible side effects. If you are taking warfarin:  Understand how to take warfarin and know which foods can affect how warfarin works in Veterinary surgeon.  Understand that it is dangerous to take too much or too little warfarin. Too much warfarin increases the risk of bleeding. Too little warfarin continues to allow the risk for blood clots.  Follow your PT and INR blood testing schedule. The PT and INR results allow your health care provider to adjust your dose of warfarin. It is very important that you have your PT and INR tested as often as told by your health care provider.  Avoid major changes in your diet, or tell your health care provider before you change your diet. Arrange a visit with a registered dietitian to answer your questions. Many foods, especially foods that are high in  vitamin K, can interfere with warfarin and affect the PT and INR results. Eat a consistent amount of foods that are high in vitamin K, such as:  Spinach, kale, broccoli, cabbage, collard greens, turnip greens, Brussels sprouts, peas, cauliflower, seaweed, and parsley.  Beef liver and pork liver.  Green tea.  Soybean oil.  Tell your health care provider about any and all medicines, vitamins, and supplements that you take, including aspirin and other over-the-counter anti-inflammatory medicines. Be especially cautious with aspirin and anti-inflammatory medicines. Do not take those before you ask your health care provider if it is safe to do so. This is important because many medicines can interfere with warfarin and affect the PT and INR results.  Do not start or stop taking any over-the-counter or prescription medicine unless your health care provider or pharmacist tells you to do so. If you take warfarin, you will also need to do these things:  Hold pressure over cuts for longer than usual.  Tell your dentist and other health care providers that you are taking warfarin  before you have any procedures in which bleeding may occur.  Avoid alcohol or drink very small amounts. Tell your health care provider if you change your alcohol intake.  Do not use tobacco products, including cigarettes, chewing tobacco, and e-cigarettes. If you need help quitting, ask your health care provider.  Avoid contact sports. General Instructions  Take over-the-counter and prescription medicines only as told by your health care provider. Anticoagulant medicines can have side effects, including easy bruising and difficulty stopping bleeding. If you are prescribed an anticoagulant, you will also need to do these things:  Hold pressure over cuts for longer than usual.  Tell your dentist and other health care providers that you are taking anticoagulants before you have any procedures in which bleeding may  occur.  Avoid contact sports.  Wear a medical alert bracelet or carry a medical alert card that says you have had a PE.  Ask your health care provider how soon you can go back to your normal activities. Stay active to prevent new blood clots from forming.  Make sure to exercise while traveling or when you have been sitting or standing for a long period of time. It is very important to exercise. Exercise your legs by walking or by tightening and relaxing your leg muscles often. Take frequent walks.  Wear compression stockings as told by your health care provider to help prevent more blood clots from forming.  Do not use tobacco products, including cigarettes, chewing tobacco, and e-cigarettes. If you need help quitting, ask your health care provider.  Keep all follow-up appointments with your health care provider. This is important. PREVENTION Take these actions to decrease your risk of developing another DVT:  Exercise regularly. For at least 30 minutes every day, engage in:  Activity that involves moving your arms and legs.  Activity that encourages good blood flow through your body by increasing your heart rate.  Exercise your arms and legs every hour during long-distance travel (over 4 hours). Drink plenty of water and avoid drinking alcohol while traveling.  Avoid sitting or lying in bed for long periods of time without moving your legs.  Maintain a weight that is appropriate for your height. Ask your health care provider what weight is healthy for you.  If you are a woman who is over 94 years of age, avoid unnecessary use of medicines that contain estrogen. These include birth control pills.  Do not smoke, especially if you take estrogen medicines. If you need help quitting, ask your health care provider. If you are hospitalized, prevention measures may include:  Early walking after surgery, as soon as your health care provider says that it is safe.  Receiving anticoagulants  to prevent blood clots.If you cannot take anticoagulants, other options may be available, such as wearing compression stockings or using different types of devices. SEEK IMMEDIATE MEDICAL CARE IF:  You have new or increased pain, swelling, or redness in an arm or leg.  You have numbness or tingling in an arm or leg.  You have shortness of breath while active or at rest.  You have chest pain.  You have a rapid or irregular heartbeat.  You feel light-headed or dizzy.  You cough up blood.  You notice blood in your vomit, bowel movement, or urine. These symptoms may represent a serious problem that is an emergency. Do not wait to see if the symptoms will go away. Get medical help right away. Call your local emergency services (911 in the [place] Do  not drive yourself to the hospital.   This information is not intended to replace advice given to you by your health care provider. Make sure you discuss any questions you have with your health care provider.   Document Released: 02/03/2005 Document Revised: 10/25/2014 Document Reviewed: 05/31/2014 Elsevier Interactive Patient Education [employer].

## 2015-01-10 NOTE — Assessment & Plan Note (Addendum)
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 Thrombus was noted to be extending up to renal veins. Her OCP was stopped. She was started on lovenox 80 mg injections bid with planned anticoagulation for 4-6 weeks prior to attempting hysterectomy by Dr. Denman George.   Today, she reports her pain is improved. The swelling in her calf has improved but she still has extensive swelling in her right thigh. Minimal discomfort, mostly pressure. Denies any shortness of breath or chest pain. Good intact sensation with good distal pulses. She is using knee high compression stockings which she believes are helping and is interested in getting thigh high stockings. She reports adherence with the lovenox injections with no problems. She would prefer to remain on the injections instead of starting warfarin. She does report some difficultly sleeping due to pressure and discomfort as well as anxiety over her current situation. She voiced understanding of the plan for hysterectomy.   PLAN Will continue the lovenox injections. Will attempt to find out if her insurance will continue to pay for the injections to determine if we need to start warfarin due to this Follow up with Dr. Denman George for hysterectomy Follow up with Dr. Beryle Beams in December Will give Rx for [facility] today Will give Rx for thigh high compression stockings

## 2015-01-10 NOTE — Progress Notes (Signed)
Patient ID: [name], female   DOB: 03-03-1980, 34 y.o.   MRN: [id number]   Subjective:   Patient ID: [name] female   DOB: 1980-03-29 34 y.o.   MRN: [id number]  HPI: Ms.[name] is a 34 y.o. female with a history of Uterine Fibroids s/p myomectomy and recent admission for right leg DVT presents to clinic today for hospital follow up. 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 Thrombus was noted to be extending up to renal veins. Her OCP was stopped. She was started on lovenox 80 mg injections bid with planned anticoagulation for 4-6 weeks prior to attempting hysterectomy by Dr. Denman George.   Today, she reports her pain is improved. The swelling in her calf has improved but she still has extensive swelling in her right thigh. Minimal discomfort, mostly pressure. Denies any shortness of breath or chest pain. Good intact sensation with good distal pulses. She is using knee high compression stockings which she believes are helping and is interested in getting thigh high stockings. She reports adherence with the lovenox injections with no problems. She would prefer to remain on the injections instead of starting warfarin. She does report some difficultly sleeping due to pressure and discomfort as well as anxiety over her current situation. She voiced understanding of the plan for hysterectomy.   Past Medical History  Diagnosis Date  . Fibroid, uterine    Current Outpatient Prescriptions  Medication Sig Dispense Refill  . enoxaparin (LOVENOX) 80 MG/0.8ML injection Inject 0.8 mLs (80 mg total) into the skin every 12 (twelve) hours. 60 Syringe 0  . HYDROcodone-acetaminophen (NORCO/VICODIN) 5-325 MG tablet Take 1 tablet by mouth every 6 (six) hours as needed for moderate pain. 30  tablet 0  . Multiple Vitamin (MULTIVITAMIN) tablet Take 1 tablet by mouth daily.    . Multiple Vitamins-Minerals (HAIR/SKIN/NAILS PO) Take 1 capsule by mouth daily.    . norethindrone (AYGESTIN) 5 MG tablet Take 5 mg by mouth daily.  2  . Omega-3 Fatty Acids (FISH OIL PO) Take 1 capsule by mouth daily.    [name] zolpidem (AMBIEN CR) 12.5 MG CR tablet Take 1 tablet (12.5 mg total) by mouth at bedtime as needed for sleep. 30 tablet 0   No current facility-administered medications for this visit.   Past Surgical History  Procedure Laterality Date  . Uterine fibroid surgery      Family History  Problem Relation Age of Onset  . Hypertension Father   . Hyperlipidemia Father   . Diabetes Father   . Cancer Maternal Grandmother     Endometrial CA  . Hyperlipidemia Paternal Grandmother    Social History   Social History  . Marital Status: Married    Spouse Name: N/A  . Number of Children: N/A  . Years of Education: N/A   Social History Main Topics  . Smoking status: Never Smoker   . Smokeless tobacco: None  . Alcohol Use: No  . Drug Use: No  . Sexual Activity: Not Asked   Other Topics Concern  . None   Social History Narrative   Review of Systems: Review of Systems  Constitutional: Negative for diaphoresis.  Respiratory: Negative for shortness of breath.   Cardiovascular: Positive for leg swelling. Negative for chest pain.  Neurological: Negative for tingling, sensory change and  focal weakness.   Objective:  Physical Exam: Filed Vitals:   01/10/15 1020  BP: 141/89  Pulse: 115  Temp: 98.2 F (36.8 C)  TempSrc: Oral  Weight: 172 lb 3.2 oz (78.109 kg)  SpO2: 100%   PHYSICAL EXAM General: alert, well-developed, and cooperative to examination.  Head: normocephalic and atraumatic.  Lungs: normal respiratory effort, no accessory muscle use, normal breath sounds, no crackles, and no wheezes. Heart: tachycardic, regular rhythm, no murmur, no gallop, and no rub.  Abdomen:  soft, non-tender, normal bowel sounds, no distention Pulses: 2+ DP/PT pulses bilaterally Extremities: marked RLE swelling, right thigh twice the size of her left thigh. Minimal edema in calf with compression stocking in place. Intact sensation. Neurologic: alert & oriented X3, cranial nerves II-XII intact, strength normal in all extremities, sensation intact to light touch, and gait normal.    Assessment & Plan:   Case discussed with Dr. Lynnae January. Please refer to Problem based charting for further details of today's visit.

## 2015-01-11 NOTE — Progress Notes (Signed)
Internal Medicine Clinic Attending  Case discussed with Dr. Boswell at the time of the visit.  We reviewed the resident's history and exam and pertinent patient test results.  I agree with the assessment, diagnosis, and plan of care documented in the resident's note.  

## 2015-01-12 LAB — FACTOR 5 LEIDEN

## 2015-01-19 ENCOUNTER — Other Ambulatory Visit: Payer: Self-pay | Admitting: Obstetrics

## 2015-01-25 ENCOUNTER — Encounter: Payer: Self-pay | Admitting: *Deleted

## 2015-01-25 ENCOUNTER — Ambulatory Visit: Payer: BLUE CROSS/BLUE SHIELD | Attending: Gynecologic Oncology | Admitting: Gynecologic Oncology

## 2015-01-25 ENCOUNTER — Encounter: Payer: Self-pay | Admitting: Gynecologic Oncology

## 2015-01-25 VITALS — BP 138/97 | HR 104 | Temp 98.7°F | Resp 19 | Ht 65.0 in | Wt 160.0 lb

## 2015-01-25 DIAGNOSIS — I871 Compression of vein: Secondary | ICD-10-CM

## 2015-01-25 DIAGNOSIS — N852 Hypertrophy of uterus: Secondary | ICD-10-CM | POA: Insufficient documentation

## 2015-01-25 DIAGNOSIS — D259 Leiomyoma of uterus, unspecified: Secondary | ICD-10-CM

## 2015-01-25 DIAGNOSIS — I82401 Acute embolism and thrombosis of unspecified deep veins of right lower extremity: Secondary | ICD-10-CM | POA: Insufficient documentation

## 2015-01-25 DIAGNOSIS — I82421 Acute embolism and thrombosis of right iliac vein: Secondary | ICD-10-CM

## 2015-01-25 NOTE — Progress Notes (Signed)
Thanks  I agree with your plan  Minimum 3 months anticoagulation before surgery    I am going to see her for first time next week. Algis Greenhouse

## 2015-01-25 NOTE — Progress Notes (Signed)
Consult Note: Gyn-Onc  Consult was requested by Dr. Valentino Saxon for the evaluation of [name] 34 y.o. female with a 30 week fibroid uterus and acute RLE DVT.  CC:  Chief Complaint  Patient presents with  . Fibroids    New consult  . DVT, right    Assessment/Plan:  [name]  is a 34 y.o.  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 Due to IVC compression she is not a candidate for IVC filter placement.  Her RLE edema has substantially improved since her treatment with lovenox. Her vaginal bleeding is substantially improved on aygestin.  In her hospital records it states that the plan would be for hysterectomy in 4-6 weeks. However, I do not believe this is the best plan for this patient.  [name] would have to be taken off Lovenox for at least 3 days perioperatively, and, in the setting of a recent VTE event and no IVC filter, this places her at extremely high risk for PE or propogation of the DVT. It also would still be associated with increased bleeding risk.  I discussed with [name] that the typical reasonable time frame for elective surgery after acute VTE is 4-6 months, at which time cessation of anticoagulation is less risky and she is not in the middle of an acute prothrombotic state.  I discussed that while I understand that her uterine fibroids have caused her VTE event, hysterectomy will not reverse this process, as it has already occurred. I have concerns about adding risk when it will not prevent harm in the acute setting.  I recommend continuing Lovenox therapy, continuing Aygestin. I will obtain an MRI of the pelvis to better characterize the uterine anatomy.  I will see her back in 3 months to plan for hysterectomy in April - May time frame.   HPI: [name] is a 34 year old G0 who is seen in consultation at the request of Dr Valentino Saxon for an enlarged uterus with fibroids and acute RLE  DVT.  Fibroids. She has undergone a abdominal myomectomy in 2009 which have controlled her symptoms of menorrhagia. She then took oral contraceptive pills for many years. However in a proximally June 2016 she began experiencing intermittent vaginal bleeding again. And was reevaluated for possibly a hysterectomy. However in November 2016 she developed symptoms of right lower extremity edema. She was admitted to was a local hospital for evaluation and an extensive right-sided lower extremity and pelvic DVT including the right common iliac vein was noted. This was felt to be secondary to compression of the fibroid uterus on the infrarenal IVC. Due to occlusion of the IVC to the level of the infrarenal level there was a failed attempt at IVC filter placement and therefore thrombectomy and IVC filter placement was attempted but aborted. Instead she was treated with anticoagulant therapy with Lovenox.  Her gynecologist is Dr. Pamala Hurry. Dr. Valentino Saxon evaluated the patient as an inpatient and a plan was made to treat with anticoagulant therapy with an interval hysterectomy. The patient was treated with Aygestin to control her bleeding symptoms. This immediately resulted in resolution of vaginal bleeding to all but intermittent vaginal spotting. Since starting Lovenox on 01/07/2015 she has noted substantial improvement in her right lower extremity edema which she feels is about 50% of what it was at the time of presentation.   Current Meds:  Outpatient Encounter Prescriptions as of 01/25/2015  Medication Sig  . enoxaparin (LOVENOX)  80 MG/0.8ML injection Inject 0.8 mLs (80 mg total) into the skin every 12 (twelve) hours.  [name] LUPRON DEPOT 11.25 MG injection   . Multiple Vitamin (MULTIVITAMIN) tablet Take 1 tablet by mouth daily.  . norethindrone (AYGESTIN) 5 MG tablet TAKE 1 TABLET BY MOUTH EVERY DAY -START 2 WEEKS PRIOR TO LUPRON, STOP BIRTH CONTROL PILL WHEN START*  . Omega-3 Fatty Acids (FISH OIL PO) Take 1  capsule by mouth daily.  [name] HYDROcodone-acetaminophen (NORCO/VICODIN) 5-325 MG tablet Take 1 tablet by mouth every 6 (six) hours as needed for moderate pain. (Patient not taking: Reported on 01/25/2015)  . zolpidem (AMBIEN CR) 12.5 MG CR tablet Take 1 tablet (12.5 mg total) by mouth at bedtime as needed for sleep. (Patient not taking: Reported on 01/25/2015)  . [DISCONTINUED] hydrochlorothiazide (HYDRODIURIL) 12.5 MG tablet Take 12.5 mg by mouth daily.  . [DISCONTINUED] MINASTRIN 24 FE 1-20 MG-MCG(24) CHEW Chew 1 tablet by mouth daily.  . [DISCONTINUED] Multiple Vitamins-Minerals (HAIR/SKIN/NAILS PO) Take 1 capsule by mouth daily.  . [DISCONTINUED] norethindrone (AYGESTIN) 5 MG tablet Take 5 mg by mouth daily.   No facility-administered encounter medications on file as of 01/25/2015.    Allergy: No Known Allergies  Social Hx:   Social History   Social History  . Marital Status: Married    Spouse Name: N/A  . Number of Children: N/A  . Years of Education: N/A   Occupational History  . Not on file.   Social History Main Topics  . Smoking status: Never Smoker   . Smokeless tobacco: Not on file  . Alcohol Use: No  . Drug Use: No  . Sexual Activity: Not Currently   Other Topics Concern  . Not on file   Social History Narrative    Past Surgical Hx:  Past Surgical History  Procedure Laterality Date  . Uterine fibroid surgery  2009    Dr. Rosana Hoes    Past Medical Hx:  Past Medical History  Diagnosis Date  . Fibroid, uterine     Past Gynecological History:  Abdominal myomectomy 2009  Patient's last menstrual period was 12/29/2014.  Family Hx:  Family History  Problem Relation Age of Onset  . Hypertension Father   . Hyperlipidemia Father   . Diabetes Father   . Cancer Maternal Grandmother     Endometrial CA  . Hyperlipidemia Paternal Grandmother     Review of Systems:  Constitutional  Feels well,    ENT Normal appearing ears and nares bilaterally Skin/Breast  No  rash, sores, jaundice, itching, dryness Cardiovascular  No chest pain, shortness of breath, or edema  Pulmonary  No cough or wheeze.  Gastro Intestinal  No nausea, vomitting, or diarrhoea. No bright red blood per rectum, no abdominal pain, change in bowel movement, or constipation.  Genito Urinary  No frequency, urgency, dysuria, + intermenstrual spotting Musculo Skeletal  No myalgia, arthralgia, joint swelling or pain  Neurologic  No weakness, numbness, change in gait,  Psychology  No depression, anxiety, insomnia.   Vitals:  Blood pressure 138/97, pulse 104, temperature 98.7 F (37.1 C), temperature source Oral, resp. rate 19, height 5\' 5"  (1.651 m), weight 160 lb (72.576 kg), last menstrual period 12/29/2014, SpO2 100 %.  Physical Exam: WD in NAD Neck  Supple NROM, without any enlargements.  Lymph Node Survey No cervical supraclavicular or inguinal adenopathy Cardiovascular  Pulse normal rate, regularity and rhythm. S1 and S2 normal.  Lungs  Clear to auscultation bilateraly, without wheezes/crackles/rhonchi. Good air movement.  Skin  No rash/lesions/breakdown  Psychiatry  Alert and oriented to person, place, and time  Abdomen  Normoactive bowel sounds, abdomen soft, non-tender and thin without evidence of hernia. Distended to 1 hands and below the xiphoid process with a  Back No CVA tenderness Genito Urinary  Vulva/vagina: Normal external female genitalia.  No lesions. No discharge or bleeding.  Bladder/urethra:  No lesions or masses, well supported bladder  Vagina: normal  Cervix: Normal appearing, no lesions.  Uterus: Bulky, large, minimally mobile, spans from left to right side.   Adnexa: no discrete masses. Rectal  Good tone, no masses no cul de sac nodularity.  Extremities  No bilateral cyanosis, clubbing or edema.   Donaciano Eva, MD  01/25/2015, 10:30 AM   CC: Dr Valentino Saxon, Dr Beryle Beams

## 2015-01-25 NOTE — Patient Instructions (Signed)
Plan for MRI on 02/08/15 and followup with Dr. Denman George in March 2017 as scheduled above.  Please do not hesitate to call our office prior to this appointment with any questions or concerns.

## 2015-01-30 ENCOUNTER — Telehealth: Payer: Self-pay | Admitting: Gynecologic Oncology

## 2015-01-30 NOTE — Telephone Encounter (Signed)
Patient had called the office asking if she should proceed with lupron injections.  Situation discussed with Dr. Denman George who wanted the patient to continue with lupron injections.  No concerns voiced

## 2015-02-01 ENCOUNTER — Ambulatory Visit (INDEPENDENT_AMBULATORY_CARE_PROVIDER_SITE_OTHER): Payer: BLUE CROSS/BLUE SHIELD | Admitting: Oncology

## 2015-02-01 ENCOUNTER — Encounter: Payer: Self-pay | Admitting: Oncology

## 2015-02-01 VITALS — BP 134/87 | HR 102 | Temp 98.0°F | Ht 65.0 in | Wt 161.1 lb

## 2015-02-01 DIAGNOSIS — I82401 Acute embolism and thrombosis of unspecified deep veins of right lower extremity: Secondary | ICD-10-CM

## 2015-02-01 DIAGNOSIS — D259 Leiomyoma of uterus, unspecified: Secondary | ICD-10-CM

## 2015-02-02 ENCOUNTER — Telehealth: Payer: Self-pay

## 2015-02-02 NOTE — Progress Notes (Signed)
Patient ID: [name], female   DOB: 1980/07/12, 34 y.o.   MRN: [id number] This is a young woman recently diagnosed with an extensive thrombosis at the level of the infrarenal IVC likely related to do direct compressive effects of a markedly enlarged fibroid uterus on the [place]. She is currently on hormonal suppression to control menorrhagia. She is on low molecular weight heparin 1 mg/kg subcutaneous every 12 hours. She was seen by GYN oncology Dr. Everitt Amber. She recommended minimum 3 months anticoagulation before considering hysterectomy. I concur with this plan.  The patient presented for a new patient visit with me. I was tied up in the hospital and wanted to schedule the patient for later in the day. However, she did not like the atmosphere in the general internal medicine clinic and declined to reschedule here. She plans to get an appointment with one of the other hematologists oncologists at the [facility].

## 2015-02-02 NOTE — Telephone Encounter (Signed)
Follow up call placed to update the patient that the referral for Hematology will scheduled by Tiffany our referral intake personal [phone] and she will be contacting her with details. Patient states understanding , denies further questions or concerns at this time.

## 2015-02-08 ENCOUNTER — Telehealth: Payer: Self-pay

## 2015-02-08 ENCOUNTER — Ambulatory Visit (HOSPITAL_COMMUNITY): Admission: RE | Admit: 2015-02-08 | Payer: BLUE CROSS/BLUE SHIELD | Source: Ambulatory Visit

## 2015-02-08 ENCOUNTER — Telehealth: Payer: Self-pay | Admitting: Hematology

## 2015-02-08 NOTE — Telephone Encounter (Signed)
Incoming call , patient inquiring if referral was placed for Hematology yet , writer informed the patient that Jonelle Sidle [phone] our referral specialist was aware . Tiffany contacted again , no answer left a VM requesting referral for a second time . Patient was given Tiffany's contact information , patient denies further questions at this time.

## 2015-02-08 NOTE — Telephone Encounter (Signed)
New patient appt-s/w patient and gave np appt for 01/05 @ 8:15 w/Dr. Irene Limbo Referring Dr. Denman George

## 2015-02-22 ENCOUNTER — Ambulatory Visit (HOSPITAL_BASED_OUTPATIENT_CLINIC_OR_DEPARTMENT_OTHER): Payer: BLUE CROSS/BLUE SHIELD | Admitting: Hematology

## 2015-02-22 ENCOUNTER — Telehealth: Payer: Self-pay | Admitting: Hematology

## 2015-02-22 ENCOUNTER — Encounter: Payer: Self-pay | Admitting: Hematology

## 2015-02-22 VITALS — BP 144/86 | HR 121 | Temp 98.7°F | Resp 18 | Ht 65.0 in | Wt 156.9 lb

## 2015-02-22 DIAGNOSIS — I82401 Acute embolism and thrombosis of unspecified deep veins of right lower extremity: Secondary | ICD-10-CM

## 2015-02-22 DIAGNOSIS — I82220 Acute embolism and thrombosis of inferior vena cava: Secondary | ICD-10-CM

## 2015-02-22 DIAGNOSIS — D6862 Lupus anticoagulant syndrome: Secondary | ICD-10-CM

## 2015-02-22 MED ORDER — RIVAROXABAN 20 MG PO TABS: 20.0000 mg | ORAL_TABLET | Freq: Every day | ORAL | Status: DC

## 2015-02-22 NOTE — Telephone Encounter (Signed)
Gv pt appt for 05/24/15.

## 2015-02-22 NOTE — Progress Notes (Signed)
[name]    HEMATOLOGY/ONCOLOGY CONSULTATION NOTE  Date of Service: 02/22/2015  Patient Care Team: Stephens Shire., MD as PCP - General (Cardiology) Dr. Valentino Saxon CHIEF COMPLAINTS/PURPOSE OF CONSULTATION:   Acute infrarenal IVC DVT  HISTORY OF PRESENTING ILLNESS:   [name] is a wonderful 35 y.o. female who has been referred to [place] by Dr .Denman George for evaluation and management of extensive infrarenal IVC DVT.  Patient has a history of uterine fibroid status post myomectomy in 2009 and was on oral contraceptives presented on 01/05/2015 with swelling in both her legs. She initially had an ultrasound of her lower extremities which were negative and therefore she was placed on HCTZ. She subsequently developed severe swelling in her right leg and went another venous duplex which revealed extensive right leg DVT leading to admission for thrombectomy and anticoagulation.  CTA scan of the chest done on 01/05/2015 did not show any evidence of pulmonary embolism.  She was noted to have a large fibroid uterus about 30 weeks' size which was causing compression of her infrarenal IVC. Thrombectomy and IVC filter placement was attempted but the stenosis of the infrarenal IVC did not allow passage of catheter. Thrombus was noted to extend up to the renal veins in the IVC. Patient was seen by Dr. Valentino Saxon from GYN, Dr. Beryle Beams and Dr. Julien Nordmann anticoagulation with interval hysterectomy was recommended . Patient was initially started on IV heparin and transitioned to lovenox on discharge.  Patient was also started on Lupron and daily norethindrone to help shrink her fibroids and help with fibroid related to bleeding while on anticoagulation.  Patient follow up with Dr. Denman George in clinic on 01/25/2015 and was recommended at least 3 months of anticoagulation prior to hysterectomy. She was seen by Dr. Beryle Beams in clinic on 02/01/2015 and at the time was on Lovenox 1 mg/kg subcutaneous every 12 hours.  She chose to follow up with [place] the [facility] where she was seen today on 02/22/2015 for the first time.  Patient notes no significant uterine or other bleeding. She notes that her bilateral lower extremity swelling is significantly improved. Still has lower abdominal and pelvic fullness.  No chest pain. No shortness of breath. Patient notes that she is dying of her Lovenox shots and would like to transition to an alternative anticoagulant if possible.  She notes that she has a MRI of the pelvis ordered for 03/09/2015 and she'll be seeing Dr. Denman George on 04/23/2015 for planning regarding hysterectomy.  No other issues with bleeding at this time.  Patient notes no family history of hereditary thrombophilia. She herself has been on oral contraceptive pills for 19-20 years without any previous thrombosis strongly suggesting against the presence of an inherited thrombophilia. Factor V Leiden mutation was negative. Prothrombin gene mutation was negative  Beta-2 glycoprotein antibodies negative. Anticardiolipin antibodies negative. Patient was noted to have presence of a lupus anticoagulant   MEDICAL HISTORY:  Past Medical History  Diagnosis Date  . Fibroid, uterine     SURGICAL HISTORY: Past Surgical History  Procedure Laterality Date  . Uterine fibroid surgery  2009    Dr. Rosana Hoes    SOCIAL HISTORY: Social History   Social History  . Marital Status: Married    Spouse Name: N/A  . Number of Children: N/A  . Years of Education: N/A   Occupational History  . Not on file.   Social History Main Topics  . Smoking status: Never Smoker   . Smokeless tobacco: Not on  file  . Alcohol Use: No  . Drug Use: No  . Sexual Activity: Yes   Other Topics Concern  . Not on file   Social History Narrative    FAMILY HISTORY: Family History  Problem Relation Age of Onset  . Hypertension Father   . Hyperlipidemia Father   . Diabetes Father   . Cancer Maternal Grandmother      Endometrial CA  . Hyperlipidemia Paternal Grandmother   Paternal first cousin with lupus Paternal grandfather congestive heart failure  ALLERGIES:  has No Known Allergies.  MEDICATIONS:  Current Outpatient Prescriptions  Medication Sig Dispense Refill  . enoxaparin (LOVENOX) 80 MG/0.8ML injection Inject 0.8 mLs (80 mg total) into the skin every 12 (twelve) hours. 60 Syringe 0  . LUPRON DEPOT 11.25 MG injection   0  . Multiple Vitamin (MULTIVITAMIN) tablet Take 1 tablet by mouth daily.    . norethindrone (AYGESTIN) 5 MG tablet TAKE 1 TABLET BY MOUTH EVERY DAY -START 2 WEEKS PRIOR TO LUPRON, STOP BIRTH CONTROL PILL WHEN START*  3  . Omega-3 Fatty Acids (FISH OIL PO) Take 1 capsule by mouth daily.     No current facility-administered medications for this visit.    REVIEW OF SYSTEMS:    10 Point review of Systems was done is negative except as noted above.  PHYSICAL EXAMINATION: ECOG PERFORMANCE STATUS: 1 - Symptomatic but completely ambulatory  . Filed Vitals:   02/22/15 0833  BP: 144/86  Pulse: 121  Temp: 98.7 F (37.1 C)  Resp: 18   Filed Weights   02/22/15 0833  Weight: 156 lb 14.4 oz (71.169 kg)   .Body mass index is 26.11 kg/(m^2).  GENERAL:alert, in no acute distress and comfortable SKIN: skin color, texture, turgor are normal, no rashes or significant lesions EYES: normal, conjunctiva are pink and non-injected, sclera clear OROPHARYNX:no exudate, no erythema and lips, buccal mucosa, and tongue normal  NECK: supple, no JVD, thyroid normal size, non-tender, without nodularity LYMPH:  no palpable lymphadenopathy in the cervical, axillary or inguinal LUNGS: clear to auscultation with normal respiratory effort HEART: regular rate & rhythm,  no murmurs and 1+ bilateral pitting pedal edema right> left ABDOMEN: abdomen soft, non-tender, normoactive bowel sounds ,No hepatosplenomegaly , lower abdominal fullness  Musculoskeletal: no cyanosis of digits and no clubbing    PSYCH: alert & oriented x 3 with fluent speech NEURO: no focal motor/sensory deficits.  LABORATORY DATA:  I have reviewed the data as listed  . CBC Latest Ref Rng 01/07/2015 01/06/2015 01/05/2015  WBC 4.0 - 10.5 K/uL 8.5 10.1 11.3(H)  Hemoglobin 12.0 - 15.0 g/dL 11.6(L) 11.8(L) 13.5  Hematocrit 36.0 - 46.0 % 35.8(L) 35.2(L) 40.8  Platelets 150 - 400 K/uL 233 220 247    . CMP Latest Ref Rng 01/05/2015  Glucose 65 - 99 mg/dL 110(H)  BUN 6 - 20 mg/dL 6  Creatinine 0.44 - 1.00 mg/dL 0.67  Sodium 135 - 145 mmol/L 136  Potassium 3.5 - 5.1 mmol/L 3.6  Chloride 101 - 111 mmol/L 103  CO2 22 - 32 mmol/L 21(L)  Calcium 8.9 - 10.3 mg/dL 9.3     RADIOGRAPHIC STUDIES: I have personally reviewed the radiological images as listed and agreed with the findings in the report. No results found.  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 This is thought to be predominantly due to mechanical compression from a large fibroid uterus measuring about 30  weeks' size. Mechanical thrombectomy/ catheter directed thrombolysis was attempted in the hospital and unsuccessful. Patient has been on Lovenox since around mid November 2016 and notes that her lower extremity swelling is much improved . She had a limited thrombophilia testing which was negative for factor V Leiden mutation and prothrombin gene mutation . Anticardiolipin antibodies and beta 2 glycoprotein antibodies were negative . Noted to have presence of a lupus anticoagulant antibody .  #2 lupus anticoagulant antibody positivity -undetermined significance .  Plan  -After discussion of anticoagulation options based on patient's preference we recommended switching her from Lovenox to Rivaroxaban. -She was recommended using left lateral position to minimize IVC compression while sleeping. -Jobst stockings for compression. -Is getting an MRI of the  pelvis on 03/09/2015 for evaluation of her uterus and surgery planning by Dr. Denman George . -Will need a repeat of her lupus anticoagulant at least 12 weeks after her previous result to determine the significance of this finding . -Agree with Dr. Denman George and Dr. Beryle Beams  regarding waiting for at least 3-4 months of anticoagulation prior to planned hysterectomy. -Patient has received Lupron and is on norethindrone as per GYN. -Patient was extensively counseled on other VTE mitigation strategies. -Patient would likely need to be transitioned back to Lovenox perioperatively for her hysterectomy with minimalization of time of anticoagulation postoperatively.  All of the patients questions were answered with apparent satisfaction. The patient knows to call the clinic with any problems, questions or concerns.  I spent 60 minutes counseling the patient face to face. The total time spent in the appointment was 60 minutes and more than 50% was on counseling and direct patient cares.    Sullivan Lone MD [place] AAHIVMS [facility] Hematology/Oncology Physician [facility]  (Office):       [phone] (Work cell):  [phone] (Fax):           [phone]  02/22/2015 8:40 AM

## 2015-02-26 ENCOUNTER — Ambulatory Visit (HOSPITAL_COMMUNITY): Admission: RE | Admit: 2015-02-26 | Payer: BLUE CROSS/BLUE SHIELD | Source: Ambulatory Visit

## 2015-03-09 ENCOUNTER — Ambulatory Visit (HOSPITAL_COMMUNITY)
Admission: RE | Admit: 2015-03-09 | Discharge: 2015-03-09 | Disposition: A | Discharge status: Home / Self Care | Payer: BLUE CROSS/BLUE SHIELD | Source: Ambulatory Visit | Attending: Gynecologic Oncology | Admitting: Gynecologic Oncology

## 2015-03-09 DIAGNOSIS — D259 Leiomyoma of uterus, unspecified: Secondary | ICD-10-CM | POA: Diagnosis not present

## 2015-03-09 DIAGNOSIS — N1330 Unspecified hydronephrosis: Secondary | ICD-10-CM | POA: Diagnosis not present

## 2015-03-09 DIAGNOSIS — K824 Cholesterolosis of gallbladder: Secondary | ICD-10-CM | POA: Diagnosis not present

## 2015-03-09 DIAGNOSIS — N134 Hydroureter: Secondary | ICD-10-CM | POA: Insufficient documentation

## 2015-03-09 DIAGNOSIS — N852 Hypertrophy of uterus: Secondary | ICD-10-CM | POA: Insufficient documentation

## 2015-03-09 MED ORDER — GADOBENATE DIMEGLUMINE 529 MG/ML IV SOLN
15.0000 mL | Freq: Once | INTRAVENOUS | Status: AC | PRN
  Administered 2015-03-09: 14 mL via INTRAVENOUS

## 2015-04-23 ENCOUNTER — Ambulatory Visit: Payer: BLUE CROSS/BLUE SHIELD | Attending: Gynecologic Oncology | Admitting: Gynecologic Oncology

## 2015-04-23 ENCOUNTER — Encounter: Payer: Self-pay | Admitting: Gynecologic Oncology

## 2015-04-23 VITALS — BP 131/90 | HR 96 | Temp 98.9°F | Resp 18 | Ht 65.0 in | Wt 148.4 lb

## 2015-04-23 DIAGNOSIS — Z7901 Long term (current) use of anticoagulants: Secondary | ICD-10-CM | POA: Insufficient documentation

## 2015-04-23 DIAGNOSIS — I82401 Acute embolism and thrombosis of unspecified deep veins of right lower extremity: Secondary | ICD-10-CM

## 2015-04-23 DIAGNOSIS — I745 Embolism and thrombosis of iliac artery: Secondary | ICD-10-CM | POA: Diagnosis not present

## 2015-04-23 DIAGNOSIS — D259 Leiomyoma of uterus, unspecified: Secondary | ICD-10-CM | POA: Diagnosis not present

## 2015-04-23 MED ORDER — NORETHINDRONE ACETATE 5 MG PO TABS: ORAL_TABLET | ORAL | Status: DC

## 2015-04-23 MED ORDER — ENOXAPARIN SODIUM 80 MG/0.8ML ~~LOC~~ SOLN: 1.0000 mg/kg | Freq: Two times a day (BID) | SUBCUTANEOUS | Status: DC

## 2015-04-23 NOTE — Patient Instructions (Addendum)
You should stop taking your xarelto 2 weeks before your surgery. Your last dose should be on March 23. The following day you will start taking lovenox twice daily. Your last dose of lovenox will be in the evening of on the day before your surgery (April 5).                Preparing for your Surgery  Plan for surgery on April 6 with Dr. Everitt Amber.  You will be scheduled for an exploratory laparotomy, total abdominal hysterectomy, bilateral salpingectomy.    Pre-operative Testing -You will receive a phone call from presurgical testing at [facility] to arrange for a pre-operative testing appointment before your surgery.  This appointment normally occurs one to two weeks before your scheduled surgery.   -Bring your insurance card, copy of an advanced directive if applicable, medication list  -At that visit, you will be asked to sign a consent for a possible blood transfusion in case a transfusion becomes necessary during surgery.  The need for a blood transfusion is rare but having consent is a necessary part of your care.     -You should not be taking blood thinners or aspirin at least ten days prior to surgery unless instructed by your surgeon.  Day Before Surgery at [facility] will be asked to take in only clear liquids the day before surgery.  Examples of clear liquids include broths, jello, and clear juices.  Avoid carbonated beverages.  You will be advised to have nothing to eat or drink after midnight the evening before.    Your role in recovery Your role is to become active as soon as directed by your doctor, while still giving yourself time to heal.  Rest when you feel tired. You will be asked to do the following in order to speed your recovery:  - Cough and breathe deeply. This helps toclear and expand your lungs and can prevent pneumonia. You may be given a spirometer to practice deep breathing. A staff member will show you how to use the spirometer. - Do mild physical  activity. Walking or moving your legs help your circulation and body functions return to normal. A staff member will help you when you try to walk and will provide you with simple exercises. Do not try to get up or walk alone the first time. - Actively manage your pain. Managing your pain lets you move in comfort. We will ask you to rate your pain on a scale of zero to 10. It is your responsibility to tell your doctor or nurse where and how much you hurt so your pain can be treated.  Special Considerations -If you are diabetic, you may be placed on insulin after surgery to have closer control over your blood sugars to promote healing and recovery.  This does not mean that you will be discharged on insulin.  If applicable, your oral antidiabetics will be resumed when you are tolerating a solid diet.  -Your final pathology results from surgery should be available by the Friday after surgery and the results will be relayed to you when available.  Blood Transfusion Information WHAT IS A BLOOD TRANSFUSION? A transfusion is the replacement of blood or some of its parts. Blood is made up of multiple cells which provide different functions.  Red blood cells carry oxygen and are used for blood loss replacement.  White blood cells fight against infection.  Platelets control bleeding.  Plasma helps clot blood.  Other blood products  are available for specialized needs, such as hemophilia or other clotting disorders. BEFORE THE TRANSFUSION  Who gives blood for transfusions?   You may be able to donate blood to be used at a later date on yourself (autologous donation).  Relatives can be asked to donate blood. This is generally not any safer than if you have received blood from a stranger. The same precautions are taken to ensure safety when a relative's blood is donated.  Healthy volunteers who are fully evaluated to make sure their blood is safe. This is blood bank blood. Transfusion therapy is the  safest it has ever been in the practice of medicine. Before blood is taken from a donor, a complete history is taken to make sure that person has no history of diseases nor engages in risky social behavior (examples are intravenous drug use or sexual activity with multiple partners). The donor's travel history is screened to minimize risk of transmitting infections, such as malaria. The donated blood is tested for signs of infectious diseases, such as HIV and hepatitis. The blood is then tested to be sure it is compatible with you in order to minimize the chance of a transfusion reaction. If you or a relative donates blood, this is often done in anticipation of surgery and is not appropriate for emergency situations. It takes many days to process the donated blood. RISKS AND COMPLICATIONS Although transfusion therapy is very safe and saves many lives, the main dangers of transfusion include:   Getting an infectious disease.  Developing a transfusion reaction. This is an allergic reaction to something in the blood you were given. Every precaution is taken to prevent this. The decision to have a blood transfusion has been considered carefully by your caregiver before blood is given. Blood is not given unless the benefits outweigh the risks.

## 2015-04-23 NOTE — Progress Notes (Signed)
Follow-up Note: Gyn-Onc  Consult was initially requested by Dr. Valentino Saxon for the evaluation of [name] 35 y.o. female with a 30 week fibroid uterus and acute RLE DVT.  CC:  Chief Complaint  Patient presents with  . Fibroids    MD follow up    Assessment/Plan:  [name]  is a 35 y.o.  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 Due to IVC compression she is not a candidate for IVC filter placement.  Her RLE edema has substantially improved since her anticoagulant therapy. Her vaginal bleeding is substantially improved on aygestin (she has had no further bleeding).  We have scheduled hysterectomy for April 6th, 2017 when she is approximately 4 months s/p DVT. She should continue Aygestin preop.  We will have her stop her xarelto 2 weeks preop and bridge her with Lovenox (which has a shorter half life). Her last dose will be the night before surgery. She is at high risk for high blood loss and we discussed this. We will faciliate a cell saver for autotransfusion and type and cross her for 2 units.  I discussed operative risks with [name] which are elevated for her due to the size of her uterus and her recent anticoagulation and her prior myomectomy. These include  bleeding, infection, damage to internal organs (such as bladder,ureters, bowels), blood clot, reoperation and rehospitalization.   HPI: [name] is a 35 year old G0 who is seen in consultation at the request of Dr Valentino Saxon for an enlarged uterus with fibroids and acute RLE DVT.  Fibroids. She has undergone a abdominal myomectomy in 2009 which have controlled her symptoms of menorrhagia. She then took oral contraceptive pills for many years. However in a proximally June 2016 she began experiencing intermittent vaginal bleeding again. And was reevaluated for possibly a hysterectomy. However in November 2016 she developed symptoms of right lower  extremity edema. She was admitted to was a local hospital for evaluation and an extensive right-sided lower extremity and pelvic DVT including the right common iliac vein was noted. This was felt to be secondary to compression of the fibroid uterus on the infrarenal IVC. Due to occlusion of the IVC to the level of the infrarenal level there was a failed attempt at IVC filter placement and therefore thrombectomy and IVC filter placement was attempted but aborted. Instead she was treated with anticoagulant therapy with Lovenox.  Her gynecologist is Dr. Pamala Hurry. Dr. Valentino Saxon evaluated the patient as an inpatient and a plan was made to treat with anticoagulant therapy with an interval hysterectomy. The patient was treated with Aygestin to control her bleeding symptoms. This immediately resulted in resolution of vaginal bleeding to all but intermittent vaginal spotting. Since starting Lovenox on 01/07/2015 she has noted substantial improvement in her right lower extremity edema which she feels is about 50% of what it was at the time of presentation.  Interval Hx: She has changed anticoagulation to xarelto (from lovenox). Edema in her LE has continued to diminish. She has had no vaginal bleeding on aygestin. She has minimal bulk side effects from her fibroid uterus.   Current Meds:  Outpatient Encounter Prescriptions as of 04/23/2015  Medication Sig  . LUPRON DEPOT 11.25 MG injection   . Multiple Vitamin (MULTIVITAMIN) tablet Take 1 tablet by mouth daily.  . norethindrone (AYGESTIN) 5 MG tablet TAKE 1 TABLET BY MOUTH EVERY DAY -START 2 WEEKS PRIOR TO LUPRON, STOP BIRTH CONTROL PILL  WHEN START*  . Omega-3 Fatty Acids (FISH OIL PO) Take 1 capsule by mouth daily.  . rivaroxaban (XARELTO) 20 MG TABS tablet Take 1 tablet (20 mg total) by mouth daily with supper.  . [DISCONTINUED] enoxaparin (LOVENOX) 80 MG/0.8ML injection Inject 0.8 mLs (80 mg total) into the skin every 12 (twelve) hours.   No  facility-administered encounter medications on file as of 04/23/2015.    Allergy: No Known Allergies  Social Hx:   Social History   Social History  . Marital Status: Married    Spouse Name: N/A  . Number of Children: N/A  . Years of Education: N/A   Occupational History  . Not on file.   Social History Main Topics  . Smoking status: Never Smoker   . Smokeless tobacco: Not on file  . Alcohol Use: No  . Drug Use: No  . Sexual Activity: Yes   Other Topics Concern  . Not on file   Social History Narrative    Past Surgical Hx:  Past Surgical History  Procedure Laterality Date  . Uterine fibroid surgery  2009    Dr. Rosana Hoes    Past Medical Hx:  Past Medical History  Diagnosis Date  . Fibroid, uterine     Past Gynecological History:  Abdominal myomectomy 2009  No LMP recorded.  Family Hx:  Family History  Problem Relation Age of Onset  . Hypertension Father   . Hyperlipidemia Father   . Diabetes Father   . Cancer Maternal Grandmother     Endometrial CA  . Hyperlipidemia Paternal Grandmother     Review of Systems:  Constitutional  Feels well,    ENT Normal appearing ears and nares bilaterally Skin/Breast  No rash, sores, jaundice, itching, dryness Cardiovascular  No chest pain, shortness of breath, or edema  Pulmonary  No cough or wheeze.  Gastro Intestinal  No nausea, vomitting, or diarrhoea. No bright red blood per rectum, no abdominal pain, change in bowel movement, or constipation.  Genito Urinary  No frequency, urgency, dysuria, + intermenstrual spotting Musculo Skeletal  No myalgia, arthralgia, joint swelling or pain  Neurologic  No weakness, numbness, change in gait,  Psychology  No depression, anxiety, insomnia.   Vitals:  Blood pressure 131/90, pulse 96, temperature 98.9 F (37.2 C), temperature source Oral, resp. rate 18, height 5\' 5"  (1.651 m), weight 148 lb 6.4 oz (67.314 kg), SpO2 100 %.  Physical Exam: WD in NAD Neck  Supple NROM,  without any enlargements.  Lymph Node Survey No cervical supraclavicular or inguinal adenopathy Cardiovascular  Pulse normal rate, regularity and rhythm. S1 and S2 normal.  Lungs  Clear to auscultation bilateraly, without wheezes/crackles/rhonchi. Good air movement.  Skin  No rash/lesions/breakdown  Psychiatry  Alert and oriented to person, place, and time  Abdomen  Normoactive bowel sounds, abdomen soft, non-tender and thin without evidence of hernia. Distended to 1 hands and below the xiphoid process with a  Back No CVA tenderness Genito Urinary  Vulva/vagina: Normal external female genitalia.  No lesions. No discharge or bleeding.  Bladder/urethra:  No lesions or masses, well supported bladder  Vagina: normal  Cervix: Normal appearing, no lesions.  Uterus: Bulky, large, minimally mobile, spans from left to right side.   Adnexa: no discrete masses. Rectal  Good tone, no masses no cul de sac nodularity.  Extremities  No bilateral cyanosis, clubbing or edema.   Donaciano Eva, MD  04/23/2015, 10:19 AM   CC: Dr Valentino Saxon

## 2015-04-26 ENCOUNTER — Telehealth: Payer: Self-pay | Admitting: Hematology

## 2015-04-26 ENCOUNTER — Other Ambulatory Visit: Payer: Self-pay | Admitting: *Deleted

## 2015-04-26 NOTE — Telephone Encounter (Signed)
returned call and s.w pt and cx appt due having surgery....she did not know when she will need to see him...transferred her to desk nurse.

## 2015-05-16 ENCOUNTER — Ambulatory Visit (HOSPITAL_BASED_OUTPATIENT_CLINIC_OR_DEPARTMENT_OTHER): Payer: BLUE CROSS/BLUE SHIELD | Admitting: Hematology

## 2015-05-16 ENCOUNTER — Telehealth: Payer: Self-pay | Admitting: Hematology

## 2015-05-16 ENCOUNTER — Other Ambulatory Visit: Payer: Self-pay | Admitting: *Deleted

## 2015-05-16 ENCOUNTER — Other Ambulatory Visit (HOSPITAL_BASED_OUTPATIENT_CLINIC_OR_DEPARTMENT_OTHER): Payer: BLUE CROSS/BLUE SHIELD

## 2015-05-16 ENCOUNTER — Encounter: Payer: Self-pay | Admitting: Hematology

## 2015-05-16 VITALS — BP 142/83 | HR 117 | Temp 98.5°F | Resp 18 | Ht 65.0 in | Wt 150.7 lb

## 2015-05-16 DIAGNOSIS — D6862 Lupus anticoagulant syndrome: Secondary | ICD-10-CM

## 2015-05-16 DIAGNOSIS — I82401 Acute embolism and thrombosis of unspecified deep veins of right lower extremity: Secondary | ICD-10-CM

## 2015-05-16 DIAGNOSIS — R760 Raised antibody titer: Secondary | ICD-10-CM

## 2015-05-16 DIAGNOSIS — I82890 Acute embolism and thrombosis of other specified veins: Secondary | ICD-10-CM | POA: Diagnosis not present

## 2015-05-16 LAB — CBC & DIFF AND RETIC
BASO%: 0.4 % (ref 0.0–2.0)
Basophils Absolute: 0 10*3/uL (ref 0.0–0.1)
EOS%: 1.9 % (ref 0.0–7.0)
Eosinophils Absolute: 0.1 10*3/uL (ref 0.0–0.5)
HCT: 42.9 % (ref 34.8–46.6)
HGB: 14.7 g/dL (ref 11.6–15.9)
Immature Retic Fract: 1.1 % — ABNORMAL LOW (ref 1.60–10.00)
LYMPH%: 42.7 % (ref 14.0–49.7)
MCH: 32 pg (ref 25.1–34.0)
MCHC: 34.3 g/dL (ref 31.5–36.0)
MCV: 93.3 fL (ref 79.5–101.0)
MONO#: 0.6 10*3/uL (ref 0.1–0.9)
MONO%: 7.9 % (ref 0.0–14.0)
NEUT%: 47.1 % (ref 38.4–76.8)
NEUTROS ABS: 3.5 10*3/uL (ref 1.5–6.5)
Platelets: 247 10*3/uL (ref 145–400)
RBC: 4.6 10*6/uL (ref 3.70–5.45)
RDW: 12.9 % (ref 11.2–14.5)
RETIC %: 0.93 % (ref 0.70–2.10)
Retic Ct Abs: 42.78 10*3/uL (ref 33.70–90.70)
WBC: 7.5 10*3/uL (ref 3.9–10.3)
lymph#: 3.2 10*3/uL (ref 0.9–3.3)

## 2015-05-16 LAB — COMPREHENSIVE METABOLIC PANEL
ALK PHOS: 31 U/L — AB (ref 40–150)
ALT: 14 U/L (ref 0–55)
AST: 12 U/L (ref 5–34)
Albumin: 4.4 g/dL (ref 3.5–5.0)
Anion Gap: 8 mEq/L (ref 3–11)
BUN: 12.8 mg/dL (ref 7.0–26.0)
CHLORIDE: 105 meq/L (ref 98–109)
CO2: 27 meq/L (ref 22–29)
Calcium: 9.5 mg/dL (ref 8.4–10.4)
Creatinine: 0.9 mg/dL (ref 0.6–1.1)
EGFR: 82 mL/min/{1.73_m2} — AB (ref >90)
Glucose: 104 mg/dl (ref 70–140)
POTASSIUM: 3.8 meq/L (ref 3.5–5.1)
Sodium: 141 mEq/L (ref 136–145)
Total Bilirubin: 0.35 mg/dL (ref 0.20–1.20)
Total Protein: 8 g/dL (ref 6.4–8.3)

## 2015-05-16 NOTE — Telephone Encounter (Signed)
Gave and printed appt shced and avs for pt for may °

## 2015-05-17 LAB — LUPUS ANTICOAGULANT PANEL
PTT-LA: 41.2 s (ref 0.0–43.6)
dRVVT: 40.6 s (ref 0.0–44.0)

## 2015-05-18 ENCOUNTER — Encounter (HOSPITAL_COMMUNITY): Payer: Self-pay

## 2015-05-18 LAB — CARDIOLIPIN ANTIBODIES, IGG, IGM, IGA

## 2015-05-18 LAB — BETA-2-GLYCOPROTEIN I ABS, IGG/M/A
Beta-2 Glyco 1 IgA: <9 GPI IgA units (ref 0–25)
Beta-2 Glyco 1 IgM: <9 GPI IgM units (ref 0–32)

## 2015-05-18 NOTE — Patient Instructions (Addendum)
[name]  05/18/2015   Your procedure is scheduled on: 05/24/2015    Report to [facility] Main  Entrance take [place]  elevators to 3rd floor to  [place] at   [place]  AM.  Call this number if you have problems the morning of surgery [phone]   Remember: ONLY 1 PERSON MAY GO WITH YOU TO SHORT STAY TO GET  READY MORNING OF [place].  Clear liquid diet beginning on 05/23/2015.  NO CARBONATED BEVERAGES. NO CLEAR LIQUIDS AFTER MIDNIGHT Wednesday NIGHT.   CLEAR LIQUID DIET   Foods Allowed                                                                     Foods Excluded  Coffee and tea, regular and decaf                             liquids that you cannot  Plain Jell-O in any flavor                                             see through such as: Fruit ices (not with fruit pulp)                                     milk, soups, orange juice  Iced Popsicles                                    All solid food                                    Cranberry, grape and apple juices Sports drinks like Gatorade Lightly seasoned clear broth or consume(fat free) Sugar, honey syrup  Sample Menu Breakfast                                Lunch                                     Supper Cranberry juice                    Beef broth                            Chicken broth Jell-O                                     Grape juice  Apple juice Coffee or tea                        Jell-O                                      Popsicle                                                Coffee or tea                        Coffee or tea  _____________________________________________________________________       Take these medicines the morning of surgery with A SIP OF WATER: none                                 You may not have any metal on your body including hair pins and              piercings  Do not wear jewelry, make-up, lotions, powders  or perfumes, deodorant             Do not wear nail polish.  Do not shave  48 hours prior to surgery.               Do not bring valuables to the hospital. [place].  Contacts, dentures or bridgework may not be worn into surgery.  Leave suitcase in the car. After surgery it may be brought to your room.       Special Instructions: coughing and deep breathing exercises, leg exercises               Please read over the following fact sheets you were given: _____________________________________________________________________             [facility] - Preparing for Surgery Before surgery, you can play an important role.  Because skin is not sterile, your skin needs to be as free of germs as possible.  You can reduce the number of germs on your skin by washing with CHG (chlorahexidine gluconate) soap before surgery.  CHG is an antiseptic cleaner which kills germs and bonds with the skin to continue killing germs even after washing. Please DO NOT use if you have an allergy to CHG or antibacterial soaps.  If your skin becomes reddened/irritated stop using the CHG and inform your nurse when you arrive at Short Stay. Do not shave (including legs and underarms) for at least 48 hours prior to the first CHG shower.  You may shave your face/neck. Please follow these instructions carefully:  1.  Shower with CHG Soap the night before surgery and the  morning of Surgery.  2.  If you choose to wash your hair, wash your hair first as usual with your  normal  shampoo.  3.  After you shampoo, rinse your hair and body thoroughly to remove the  shampoo.                           4.  Use CHG as you would any other liquid soap.  You can apply chg directly  to the skin and wash                       Gently with a scrungie or clean washcloth.  5.  Apply the CHG Soap to your body ONLY FROM THE NECK DOWN.   Do not use on face/ open                           Wound or  open sores. Avoid contact with eyes, ears mouth and genitals (private parts).                       Wash face,  Genitals (private parts) with your normal soap.             6.  Wash thoroughly, paying special attention to the area where your surgery  will be performed.  7.  Thoroughly rinse your body with warm water from the neck down.  8.  DO NOT shower/wash with your normal soap after using and rinsing off  the CHG Soap.                9.  Pat yourself dry with a clean towel.            10.  Wear clean pajamas.            11.  Place clean sheets on your bed the night of your first shower and do not  sleep with pets. Day of Surgery : Do not apply any lotions/deodorants the morning of surgery.  Please wear clean clothes to the hospital/surgery center.  FAILURE TO FOLLOW THESE INSTRUCTIONS MAY RESULT IN THE CANCELLATION OF YOUR SURGERY PATIENT SIGNATURE_________________________________  NURSE SIGNATURE__________________________________  ________________________________________________________________________  WHAT IS A BLOOD TRANSFUSION? Blood Transfusion Information  A transfusion is the replacement of blood or some of its parts. Blood is made up of multiple cells which provide different functions.  Red blood cells carry oxygen and are used for blood loss replacement.  White blood cells fight against infection.  Platelets control bleeding.  Plasma helps clot blood.  Other blood products are available for specialized needs, such as hemophilia or other clotting disorders. BEFORE THE TRANSFUSION  Who gives blood for transfusions?   Healthy volunteers who are fully evaluated to make sure their blood is safe. This is blood bank blood. Transfusion therapy is the safest it has ever been in the practice of medicine. Before blood is taken from a donor, a complete history is taken to make sure that person has no history of diseases nor engages in risky social behavior (examples are  intravenous drug use or sexual activity with multiple partners). The donor's travel history is screened to minimize risk of transmitting infections, such as malaria. The donated blood is tested for signs of infectious diseases, such as HIV and hepatitis. The blood is then tested to be sure it is compatible with you in order to minimize the chance of a transfusion reaction. If you or a relative donates blood, this is often done in anticipation of surgery and is not appropriate for emergency situations. It takes many days to process the donated blood. RISKS AND COMPLICATIONS Although transfusion therapy is very safe and saves many lives, the main dangers of transfusion include:  1. Getting an infectious disease. 2. Developing a transfusion reaction. This is  an allergic reaction to something in the blood you were given. Every precaution is taken to prevent this. The decision to have a blood transfusion has been considered carefully by your caregiver before blood is given. Blood is not given unless the benefits outweigh the risks. AFTER THE TRANSFUSION  Right after receiving a blood transfusion, you will usually feel much better and more energetic. This is especially true if your red blood cells have gotten low (anemic). The transfusion raises the level of the red blood cells which carry oxygen, and this usually causes an energy increase.  The nurse administering the transfusion will monitor you carefully for complications. HOME CARE INSTRUCTIONS  No special instructions are needed after a transfusion. You may find your energy is better. Speak with your caregiver about any limitations on activity for underlying diseases you may have. SEEK MEDICAL CARE IF:   Your condition is not improving after your transfusion.  You develop redness or irritation at the intravenous (IV) site. SEEK IMMEDIATE MEDICAL CARE IF:  Any of the following symptoms occur over the next 12 hours:  Shaking chills.  You have a  temperature by mouth above 102 F (38.9 C), not controlled by medicine.  Chest, back, or muscle pain.  People around you feel you are not acting correctly or are confused.  Shortness of breath or difficulty breathing.  Dizziness and fainting.  You get a rash or develop hives.  You have a decrease in urine output.  Your urine turns a dark color or changes to pink, red, or brown. Any of the following symptoms occur over the next 10 days:  You have a temperature by mouth above 102 F (38.9 C), not controlled by medicine.  Shortness of breath.  Weakness after normal activity.  The white part of the eye turns yellow (jaundice).  You have a decrease in the amount of urine or are urinating less often.  Your urine turns a dark color or changes to pink, red, or brown. Document Released: 02/01/2000 Document Revised: 04/28/2011 Document Reviewed: 09/20/2007 ExitCare Patient Information 2014 [place].  _______________________________________________________________________  Incentive Spirometer  An incentive spirometer is a tool that can help keep your lungs clear and active. This tool measures how well you are filling your lungs with each breath. Taking long deep breaths may help reverse or decrease the chance of developing breathing (pulmonary) problems (especially infection) following:  A long period of time when you are unable to move or be active. BEFORE THE PROCEDURE   If the spirometer includes an indicator to show your best effort, your nurse or respiratory therapist will set it to a desired goal.  If possible, sit up straight or lean slightly forward. Try not to slouch.  Hold the incentive spirometer in an upright position. INSTRUCTIONS FOR USE  3. Sit on the edge of your bed if possible, or sit up as far as you can in bed or on a chair. 4. Hold the incentive spirometer in an upright position. 5. Breathe out normally. 6. Place the mouthpiece in your mouth and  seal your lips tightly around it. 7. Breathe in slowly and as deeply as possible, raising the piston or the ball toward the top of the column. 8. Hold your breath for 3-5 seconds or for as long as possible. Allow the piston or ball to fall to the bottom of the column. 9. Remove the mouthpiece from your mouth and breathe out normally. 10. Rest for a few seconds and repeat Steps 1 through  7 at least 10 times every 1-2 hours when you are awake. Take your time and take a few normal breaths between deep breaths. 11. The spirometer may include an indicator to show your best effort. Use the indicator as a goal to work toward during each repetition. 12. After each set of 10 deep breaths, practice coughing to be sure your lungs are clear. If you have an incision (the cut made at the time of surgery), support your incision when coughing by placing a pillow or rolled up towels firmly against it. Once you are able to get out of bed, walk around indoors and cough well. You may stop using the incentive spirometer when instructed by your caregiver.  RISKS AND COMPLICATIONS  Take your time so you do not get dizzy or light-headed.  If you are in pain, you may need to take or ask for pain medication before doing incentive spirometry. It is harder to take a deep breath if you are having pain. AFTER USE  Rest and breathe slowly and easily.  It can be helpful to keep track of a log of your progress. Your caregiver can provide you with a simple table to help with this. If you are using the spirometer at home, follow these instructions: [place] IF:   You are having difficultly using the spirometer.  You have trouble using the spirometer as often as instructed.  Your pain medication is not giving enough relief while using the spirometer.  You develop fever of 100.5 F (38.1 C) or higher. SEEK IMMEDIATE MEDICAL CARE IF:   You cough up bloody sputum that had not been present before.  You develop  fever of 102 F (38.9 C) or greater.  You develop worsening pain at or near the incision site. MAKE SURE YOU:   Understand these instructions.  Will watch your condition.  Will get help right away if you are not doing well or get worse. Document Released: 06/16/2006 Document Revised: 04/28/2011 Document Reviewed: 08/17/2006 [facility] Patient Information 2014 [place], [place].   ________________________________________________________________________

## 2015-05-21 ENCOUNTER — Encounter (HOSPITAL_COMMUNITY)
Admission: RE | Admit: 2015-05-21 | Discharge: 2015-05-21 | Disposition: A | Discharge status: Home / Self Care | Payer: BLUE CROSS/BLUE SHIELD | Source: Ambulatory Visit | Attending: Gynecologic Oncology | Admitting: Gynecologic Oncology

## 2015-05-21 ENCOUNTER — Encounter (HOSPITAL_COMMUNITY): Payer: Self-pay

## 2015-05-21 HISTORY — DX: Disease of blood and blood-forming organs, unspecified: D75.9

## 2015-05-21 HISTORY — DX: Acute embolism and thrombosis of unspecified deep veins of unspecified lower extremity: I82.409

## 2015-05-21 HISTORY — DX: Anemia, unspecified: D64.9

## 2015-05-21 LAB — URINALYSIS, ROUTINE W REFLEX MICROSCOPIC
Bilirubin Urine: NEGATIVE
GLUCOSE, UA: NEGATIVE mg/dL
HGB URINE DIPSTICK: NEGATIVE
Ketones, ur: NEGATIVE mg/dL
Nitrite: NEGATIVE
Protein, ur: NEGATIVE mg/dL
SPECIFIC GRAVITY, URINE: 1.011 (ref 1.005–1.030)
pH: 6.5 (ref 5.0–8.0)

## 2015-05-21 LAB — APTT: aPTT: 33 seconds (ref 24–37)

## 2015-05-21 LAB — PREPARE RBC (CROSSMATCH)

## 2015-05-21 LAB — URINE MICROSCOPIC-ADD ON: RBC / HPF: NONE SEEN RBC/hpf (ref 0–5)

## 2015-05-21 LAB — PROTIME-INR
INR: 1.04 (ref 0.00–1.49)
PROTHROMBIN TIME: 13.8 s (ref 11.6–15.2)

## 2015-05-21 LAB — PREGNANCY, URINE: PREG TEST UR: NEGATIVE

## 2015-05-21 NOTE — Progress Notes (Signed)
Micro, ua results routed to [place] [facility] by epic

## 2015-05-22 DIAGNOSIS — R760 Raised antibody titer: Secondary | ICD-10-CM | POA: Insufficient documentation

## 2015-05-22 LAB — ABO/RH: ABO/RH(D): A POS

## 2015-05-22 NOTE — Progress Notes (Signed)
[name]    HEMATOLOGY/ONCOLOGY CLINIC NOTE  Date of Service: .05/16/2015   Patient Care Team: No Pcp Per Patient as PCP - General (General Practice) Dr. 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   2)  lupus anticoagulant positive ?[name] Acquired thrombophilia .  HISTORY OF PRESENTING ILLNESS:   [name] is a wonderful 35 y.o. female who has been referred to [place] by Dr .Denman George for evaluation and management of extensive infrarenal IVC DVT.  Patient has a history of uterine fibroid status post myomectomy in 2009 and was on oral contraceptives presented on 01/05/2015 with swelling in both her legs. She initially had an ultrasound of her lower extremities which were negative and therefore she was placed on HCTZ. She subsequently developed severe swelling in her right leg and went another venous duplex which revealed extensive right leg DVT leading to admission for thrombectomy and anticoagulation.  CTA scan of the chest done on 01/05/2015 did not show any evidence of pulmonary embolism.  She was noted to have a large fibroid uterus about 30 weeks' size which was causing compression of her infrarenal IVC. Thrombectomy and IVC filter placement was attempted but the stenosis of the infrarenal IVC did not allow passage of catheter. Thrombus was noted to extend up to the renal veins in the IVC. Patient was seen by Dr. Valentino Saxon from GYN, Dr. Beryle Beams and Dr. Julien Nordmann anticoagulation with interval hysterectomy was recommended . Patient was initially started on IV heparin and transitioned to lovenox on discharge.  Patient was also started on Lupron and daily norethindrone to help shrink her fibroids and help with fibroid related to bleeding while on anticoagulation.  Patient follow up with Dr. Denman George in  clinic on 01/25/2015 and was recommended at least 3 months of anticoagulation prior to hysterectomy. She was seen by Dr. Beryle Beams in clinic on 02/01/2015 and at the time was on Lovenox 1 mg/kg subcutaneous every 12 hours. She chose to follow up with [place] the [facility] where she was seen today on 02/22/2015 for the first time.  Patient notes no significant uterine or other bleeding. She notes that her bilateral lower extremity swelling is significantly improved. Still has lower abdominal and pelvic fullness.  No chest pain. No shortness of breath. Patient notes that she is dying of her Lovenox shots and would like to transition to an alternative anticoagulant if possible.  She notes that she has a MRI of the pelvis ordered for 03/09/2015 and she'll be seeing Dr. Denman George on 04/23/2015 for planning regarding hysterectomy.  No other issues with bleeding at this time.  Patient notes no family history of hereditary thrombophilia. She herself has been on oral contraceptive pills for 19-20 years without any previous thrombosis strongly suggesting against the presence of an inherited thrombophilia. Factor V Leiden mutation was negative. Prothrombin gene mutation was negative  Beta-2 glycoprotein antibodies negative. Anticardiolipin antibodies negative. Patient was noted to have presence of a lupus anticoagulant   INTERVAL HISTORY  Ms Helgesen is here scheduled follow-up. She has been on Rivaroxaban since 02/22/2015 and on therapeutic anticoagulation from 01/05/2015 (more than 4 months). She notes that her lower extremity swelling is nearly resolved though her right leg still feels somewhat bigger than the left especially at the end of the day after she has walked .  She notes that Dr.  Denman George has transitioned her to Lovenox from 05/10/2015 and that she is scheduled for her hysterectomy on 05/24/2015. Notes no issues with bleeding or excessive bruising. She had repeat labs including lupus anticoagulant  drawn today.    MEDICAL HISTORY:  Past Medical History  Diagnosis Date  . Fibroid, uterine   . DVT (deep venous thrombosis) ([place])   . Blood dyscrasia   . Anemia     SURGICAL HISTORY: Past Surgical History  Procedure Laterality Date  . Uterine fibroid surgery  2009    Dr. Rosana Hoes    SOCIAL HISTORY: Social History   Social History  . Marital Status: Married    Spouse Name: N/A  . Number of Children: N/A  . Years of Education: N/A   Occupational History  . Not on file.   Social History Main Topics  . Smoking status: Never Smoker   . Smokeless tobacco: Never Used  . Alcohol Use: No  . Drug Use: No  . Sexual Activity: Yes   Other Topics Concern  . Not on file   Social History Narrative    FAMILY HISTORY: Family History  Problem Relation Age of Onset  . Hypertension Father   . Hyperlipidemia Father   . Diabetes Father   . Cancer Maternal Grandmother     Endometrial CA  . Hyperlipidemia Paternal Grandmother   Paternal first cousin with lupus Paternal grandfather congestive heart failure  ALLERGIES:  has No Known Allergies.  MEDICATIONS:  Current Outpatient Prescriptions  Medication Sig Dispense Refill  . enoxaparin (LOVENOX) 80 MG/0.8ML injection Inject 0.65 mLs (65 mg total) into the skin every 12 (twelve) hours. 30 Syringe 0  . Multiple Vitamin (MULTIVITAMIN) tablet Take 1 tablet by mouth daily.    . norethindrone (AYGESTIN) 5 MG tablet TAKE 1 TABLET BY MOUTH EVERY DAY 30 tablet 3  . rivaroxaban (XARELTO) 20 MG TABS tablet Take 1 tablet (20 mg total) by mouth daily with supper. (Patient not taking: Reported on 05/16/2015) 30 tablet 3   No current facility-administered medications for this visit.    REVIEW OF SYSTEMS:    10 Point review of Systems was done is negative except as noted above.  PHYSICAL EXAMINATION: ECOG PERFORMANCE STATUS: 1 - Symptomatic but completely ambulatory  . Filed Vitals:   05/16/15 1546  BP: 142/83  Pulse: 117  Temp:  98.5 F (36.9 C)  Resp: 18   Filed Weights   05/16/15 1546  Weight: 150 lb 11.2 oz (68.357 kg)   .Body mass index is 25.08 kg/(m^2).  GENERAL:alert, in no acute distress and comfortable SKIN: skin color, texture, turgor are normal, no rashes or significant lesions EYES: normal, conjunctiva are pink and non-injected, sclera clear OROPHARYNX:no exudate, no erythema and lips, buccal mucosa, and tongue normal  NECK: supple, no JVD, thyroid normal size, non-tender, without nodularity LYMPH:  no palpable lymphadenopathy in the cervical, axillary or inguinal LUNGS: clear to auscultation with normal respiratory effort HEART: regular rate & rhythm,  no murmurs and 1+ bilateral pitting pedal edema right> left ABDOMEN: abdomen soft, non-tender, normoactive bowel sounds ,No hepatosplenomegaly , lower abdominal fullness  Musculoskeletal: no cyanosis of digits and no clubbing  PSYCH: alert & oriented x 3 with fluent speech NEURO: no focal motor/sensory deficits.  LABORATORY DATA:  I have reviewed the data as listed  . CBC Latest Ref Rng 05/16/2015 01/07/2015 01/06/2015  WBC 3.9 - 10.3 10e3/uL 7.5 8.5 10.1  Hemoglobin 11.6 - 15.9 g/dL 14.7 11.6(L) 11.8(L)  Hematocrit 34.8 - 46.6 %  42.9 35.8(L) 35.2(L)  Platelets 145 - 400 10e3/uL 247 233 220    . CMP Latest Ref Rng 05/16/2015 01/05/2015  Glucose 70 - 140 mg/dl 104 110(H)  BUN 7.0 - 26.0 mg/dL 12.8 6  Creatinine 0.6 - 1.1 mg/dL 0.9 0.67  Sodium 136 - 145 mEq/L 141 136  Potassium 3.5 - 5.1 mEq/L 3.8 3.6  Chloride 101 - 111 mmol/L - 103  CO2 22 - 29 mEq/L 27 21(L)  Calcium 8.4 - 10.4 mg/dL 9.5 9.3  Total Protein 6.4 - 8.3 g/dL 8.0 -  Total Bilirubin 0.20 - 1.20 mg/dL 0.35 -  Alkaline Phos 40 - 150 U/L 31(L) -  AST 5 - 34 U/L 12 -  ALT 0 - 55 U/L 14 -   Component     Latest Ref Rng 05/16/2015  PTT-LA     0.0 - 43.6 sec 41.2  DRVVT     0.0 - 44.0 sec 40.6  Lupus Anticoag Interp      Comment:  Anticardiolipin Ab,IgG,Qn     0 - 14  GPL U/mL <9  Anticardiolipin Ab,IgM,Qn     0 - 12 MPL U/mL <9  Anticardiolipin Ab,IgA,Qn     0 - 11 APL U/mL <9  Beta-2 Glycoprotein I Ab, IgG     0 - 20 GPI IgG units <9  Beta-2 Glyco 1 IgA     0 - 25 GPI IgA units <9  Beta-2 Glyco 1 IgM     0 - 32 GPI IgM units <9   Lupus anticoagulant panel      No reference range information available      Comments: No lupus anticoagulant was detected   RADIOGRAPHIC STUDIES: I have personally reviewed the radiological images as listed and agreed with the findings in the report. No results found.  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 This is thought to be predominantly due to mechanical compression from a large fibroid uterus measuring about 30 weeks' size. Mechanical thrombectomy/ catheter directed thrombolysis was attempted in the hospital and unsuccessful. Patient has been on Lovenox since around mid November 2016 and notes that her lower extremity swelling is much improved . She had a limited thrombophilia testing which was negative for factor V Leiden mutation and prothrombin gene mutation . Anticardiolipin antibodies and beta 2 glycoprotein antibodies were negative . Noted to have presence of a lupus anticoagulant antibody .  #2 lupus anticoagulant antibody positivity in 12/2014. Repeat lupus anticoagulant drawn today was negative suggesting that this is likely not a true ongoing risk for thrombosis.  Plan  -Patient has been transitioned from Rivaroxaban to therapeutic doses of Lovenox in preparing for her upcoming hysterectomy by Dr. Denman George . -Hysterectomy has been scheduled for 05/24/2015  -She will be holding her nighttime dose of Lovenox on 05/23/2015 . -Would recommend getting back on prophylactic Lovenox as soon as possible after surgery about 12 hours out after surgery if no issues with bleeding . -Transitioning to therapeutic Lovenox  in a couple of days after surgery if no issues with bleeding. -Patient will need to complete at least 6 months of anticoagulation potentially longer even extensive clot burden. -Continue using compression socks due to high risk of post-thrombotic syndrome.  -Return to care with Dr. Irene Limbo in one month to determine choice and duration of anticoagulation.   Sullivan Lone MD [place] AAHIVMS [facility] Hematology/Oncology Physician [place]  (Office):  [phone] (Work cell):  [phone] (Fax):           [phone]

## 2015-05-24 ENCOUNTER — Inpatient Hospital Stay (HOSPITAL_COMMUNITY): Payer: BLUE CROSS/BLUE SHIELD | Admitting: Certified Registered"

## 2015-05-24 ENCOUNTER — Other Ambulatory Visit: Payer: Self-pay

## 2015-05-24 ENCOUNTER — Encounter (HOSPITAL_COMMUNITY): Payer: Self-pay | Admitting: *Deleted

## 2015-05-24 ENCOUNTER — Inpatient Hospital Stay (HOSPITAL_COMMUNITY)
Admission: RE | Admit: 2015-05-24 | Discharge: 2015-05-26 | DRG: 742 | Disposition: A | Discharge status: Home / Self Care | Payer: BLUE CROSS/BLUE SHIELD | Source: Ambulatory Visit | Attending: Obstetrics & Gynecology | Admitting: Obstetrics & Gynecology

## 2015-05-24 ENCOUNTER — Ambulatory Visit: Payer: Self-pay | Admitting: Hematology

## 2015-05-24 ENCOUNTER — Encounter (HOSPITAL_COMMUNITY)
Admission: RE | Disposition: A | Discharge status: Home / Self Care | Payer: Self-pay | Source: Ambulatory Visit | Attending: Obstetrics & Gynecology

## 2015-05-24 DIAGNOSIS — R110 Nausea: Secondary | ICD-10-CM | POA: Diagnosis not present

## 2015-05-24 DIAGNOSIS — Z86718 Personal history of other venous thrombosis and embolism: Secondary | ICD-10-CM

## 2015-05-24 DIAGNOSIS — D259 Leiomyoma of uterus, unspecified: Secondary | ICD-10-CM | POA: Diagnosis present

## 2015-05-24 DIAGNOSIS — R760 Raised antibody titer: Secondary | ICD-10-CM | POA: Diagnosis present

## 2015-05-24 DIAGNOSIS — I745 Embolism and thrombosis of iliac artery: Secondary | ICD-10-CM | POA: Diagnosis present

## 2015-05-24 DIAGNOSIS — D252 Subserosal leiomyoma of uterus: Secondary | ICD-10-CM

## 2015-05-24 DIAGNOSIS — I871 Compression of vein: Secondary | ICD-10-CM | POA: Diagnosis not present

## 2015-05-24 DIAGNOSIS — D251 Intramural leiomyoma of uterus: Secondary | ICD-10-CM

## 2015-05-24 DIAGNOSIS — Z8249 Family history of ischemic heart disease and other diseases of the circulatory system: Secondary | ICD-10-CM

## 2015-05-24 DIAGNOSIS — Z7901 Long term (current) use of anticoagulants: Secondary | ICD-10-CM | POA: Diagnosis not present

## 2015-05-24 HISTORY — PX: LAPAROTOMY: SHX154

## 2015-05-24 SURGERY — LAPAROTOMY, EXPLORATORY
Anesthesia: General | Site: Abdomen

## 2015-05-24 MED ORDER — LACTATED RINGERS IV SOLN: INTRAVENOUS | Status: DC

## 2015-05-24 MED ORDER — MIDAZOLAM HCL 5 MG/5ML IJ SOLN
INTRAMUSCULAR | Status: DC | PRN
  Administered 2015-05-24: 2 mg via INTRAVENOUS

## 2015-05-24 MED ORDER — ACETAMINOPHEN 500 MG PO TABS
1000.0000 mg | ORAL_TABLET | Freq: Four times a day (QID) | ORAL | Status: DC
  Administered 2015-05-24 – 2015-05-26 (×7): 1000 mg via ORAL
  Filled 2015-05-24 (×7): qty 2

## 2015-05-24 MED ORDER — FENTANYL CITRATE (PF) 100 MCG/2ML IJ SOLN
25.0000 ug | INTRAMUSCULAR | Status: DC | PRN
  Administered 2015-05-24: 50 ug via INTRAVENOUS

## 2015-05-24 MED ORDER — 0.9 % SODIUM CHLORIDE (POUR BTL) OPTIME
TOPICAL | Status: DC | PRN
  Administered 2015-05-24: 2000 mL

## 2015-05-24 MED ORDER — BUPIVACAINE HCL 0.25 % IJ SOLN
INTRAMUSCULAR | Status: DC | PRN
  Administered 2015-05-24: 20 mL

## 2015-05-24 MED ORDER — IBUPROFEN 800 MG PO TABS
800.0000 mg | ORAL_TABLET | Freq: Four times a day (QID) | ORAL | Status: DC
  Administered 2015-05-25 – 2015-05-26 (×3): 800 mg via ORAL
  Filled 2015-05-24 (×8): qty 1

## 2015-05-24 MED ORDER — OXYCODONE HCL 5 MG PO TABS
10.0000 mg | ORAL_TABLET | ORAL | Status: DC | PRN
  Administered 2015-05-24 – 2015-05-26 (×3): 10 mg via ORAL
  Filled 2015-05-24 (×3): qty 2

## 2015-05-24 MED ORDER — FENTANYL CITRATE (PF) 100 MCG/2ML IJ SOLN
INTRAMUSCULAR | Status: DC | PRN
  Administered 2015-05-24: 50 ug via INTRAVENOUS
  Administered 2015-05-24: 100 ug via INTRAVENOUS
  Administered 2015-05-24 (×2): 50 ug via INTRAVENOUS

## 2015-05-24 MED ORDER — ENOXAPARIN SODIUM 40 MG/0.4ML ~~LOC~~ SOLN
40.0000 mg | SUBCUTANEOUS | Status: DC
  Administered 2015-05-25 – 2015-05-26 (×2): 40 mg via SUBCUTANEOUS
  Filled 2015-05-24 (×3): qty 0.4

## 2015-05-24 MED ORDER — ONDANSETRON HCL 4 MG/2ML IJ SOLN
4.0000 mg | Freq: Four times a day (QID) | INTRAMUSCULAR | Status: DC | PRN
  Administered 2015-05-24 – 2015-05-25 (×2): 4 mg via INTRAVENOUS
  Filled 2015-05-24 (×2): qty 2

## 2015-05-24 MED ORDER — LACTATED RINGERS IV SOLN
INTRAVENOUS | Status: DC | PRN
  Administered 2015-05-24: 07:00:00 via INTRAVENOUS

## 2015-05-24 MED ORDER — MEPERIDINE HCL 50 MG/ML IJ SOLN: 6.2500 mg | INTRAMUSCULAR | Status: DC | PRN

## 2015-05-24 MED ORDER — LIDOCAINE HCL (PF) 2 % IJ SOLN
INTRAMUSCULAR | Status: DC | PRN
  Administered 2015-05-24: 20 mg via INTRADERMAL

## 2015-05-24 MED ORDER — KETOROLAC TROMETHAMINE 30 MG/ML IJ SOLN
15.0000 mg | Freq: Four times a day (QID) | INTRAMUSCULAR | Status: AC
  Administered 2015-05-24 – 2015-05-25 (×4): 15 mg via INTRAVENOUS
  Filled 2015-05-24 (×4): qty 1

## 2015-05-24 MED ORDER — BUPIVACAINE LIPOSOME 1.3 % IJ SUSP
20.0000 mL | Freq: Once | INTRAMUSCULAR | Status: AC
  Administered 2015-05-24: 20 mL
  Filled 2015-05-24: qty 20

## 2015-05-24 MED ORDER — CEFAZOLIN SODIUM-DEXTROSE 2-4 GM/100ML-% IV SOLN
2.0000 g | INTRAVENOUS | Status: AC
  Administered 2015-05-24: 2 g via INTRAVENOUS

## 2015-05-24 MED ORDER — CEFAZOLIN SODIUM-DEXTROSE 2-4 GM/100ML-% IV SOLN
INTRAVENOUS | Status: AC
  Filled 2015-05-24: qty 100

## 2015-05-24 MED ORDER — HYDROMORPHONE HCL 1 MG/ML IJ SOLN
INTRAMUSCULAR | Status: DC | PRN
  Administered 2015-05-24 (×2): 0.5 mg via INTRAVENOUS
  Administered 2015-05-24: 1 mg via INTRAVENOUS

## 2015-05-24 MED ORDER — BUPIVACAINE HCL (PF) 0.25 % IJ SOLN
INTRAMUSCULAR | Status: AC
  Filled 2015-05-24: qty 30

## 2015-05-24 MED ORDER — HYDROMORPHONE HCL 2 MG/ML IJ SOLN
INTRAMUSCULAR | Status: AC
  Filled 2015-05-24: qty 1

## 2015-05-24 MED ORDER — GABAPENTIN 300 MG PO CAPS
600.0000 mg | ORAL_CAPSULE | Freq: Every day | ORAL | Status: AC
  Administered 2015-05-24: 600 mg via ORAL
  Filled 2015-05-24: qty 2

## 2015-05-24 MED ORDER — FENTANYL CITRATE (PF) 250 MCG/5ML IJ SOLN
INTRAMUSCULAR | Status: AC
  Filled 2015-05-24: qty 5

## 2015-05-24 MED ORDER — SUGAMMADEX SODIUM 200 MG/2ML IV SOLN
INTRAVENOUS | Status: DC | PRN
  Administered 2015-05-24: 150 mg via INTRAVENOUS

## 2015-05-24 MED ORDER — ACETAMINOPHEN 10 MG/ML IV SOLN
1000.0000 mg | Freq: Once | INTRAVENOUS | Status: AC
  Administered 2015-05-24: 1000 mg via INTRAVENOUS

## 2015-05-24 MED ORDER — ACETAMINOPHEN 10 MG/ML IV SOLN
INTRAVENOUS | Status: AC
  Filled 2015-05-24: qty 100

## 2015-05-24 MED ORDER — FENTANYL CITRATE (PF) 100 MCG/2ML IJ SOLN
INTRAMUSCULAR | Status: AC
  Administered 2015-05-24: 25 ug
  Filled 2015-05-24: qty 2

## 2015-05-24 MED ORDER — KCL IN DEXTROSE-NACL 20-5-0.45 MEQ/L-%-% IV SOLN
INTRAVENOUS | Status: DC
  Administered 2015-05-24: 13:00:00 via INTRAVENOUS
  Administered 2015-05-25: 50 mL/h via INTRAVENOUS
  Administered 2015-05-25: 03:00:00 via INTRAVENOUS
  Filled 2015-05-24 (×5): qty 1000

## 2015-05-24 MED ORDER — KETOROLAC TROMETHAMINE 30 MG/ML IJ SOLN
15.0000 mg | Freq: Four times a day (QID) | INTRAMUSCULAR | Status: AC
  Filled 2015-05-24 (×2): qty 1

## 2015-05-24 MED ORDER — KETOROLAC TROMETHAMINE 30 MG/ML IJ SOLN
INTRAMUSCULAR | Status: AC
  Filled 2015-05-24: qty 1

## 2015-05-24 MED ORDER — KETOROLAC TROMETHAMINE 30 MG/ML IJ SOLN
30.0000 mg | Freq: Once | INTRAMUSCULAR | Status: AC
  Administered 2015-05-24: 30 mg via INTRAVENOUS

## 2015-05-24 MED ORDER — SODIUM CHLORIDE 0.9 % IJ SOLN
INTRAMUSCULAR | Status: DC | PRN
  Administered 2015-05-24: 20 mL

## 2015-05-24 MED ORDER — PROPOFOL 10 MG/ML IV BOLUS
INTRAVENOUS | Status: DC | PRN
  Administered 2015-05-24: 120 mg via INTRAVENOUS

## 2015-05-24 MED ORDER — ONDANSETRON HCL 4 MG/2ML IJ SOLN: 4.0000 mg | Freq: Once | INTRAMUSCULAR | Status: DC

## 2015-05-24 MED ORDER — ENSURE ENLIVE PO LIQD
237.0000 mL | Freq: Two times a day (BID) | ORAL | Status: DC
  Administered 2015-05-24 – 2015-05-25 (×2): 237 mL via ORAL

## 2015-05-24 MED ORDER — MIDAZOLAM HCL 2 MG/2ML IJ SOLN
INTRAMUSCULAR | Status: AC
  Filled 2015-05-24: qty 2

## 2015-05-24 MED ORDER — ONDANSETRON HCL 4 MG/2ML IJ SOLN
INTRAMUSCULAR | Status: AC
  Filled 2015-05-24: qty 2

## 2015-05-24 MED ORDER — PROCHLORPERAZINE EDISYLATE 5 MG/ML IJ SOLN
10.0000 mg | Freq: Four times a day (QID) | INTRAMUSCULAR | Status: DC | PRN
  Administered 2015-05-24: 10 mg via INTRAVENOUS
  Filled 2015-05-24 (×2): qty 2

## 2015-05-24 MED ORDER — SODIUM CHLORIDE 0.9 % IJ SOLN
INTRAMUSCULAR | Status: AC
  Filled 2015-05-24: qty 20

## 2015-05-24 MED ORDER — DEXAMETHASONE SODIUM PHOSPHATE 10 MG/ML IJ SOLN
INTRAMUSCULAR | Status: AC
  Filled 2015-05-24: qty 1

## 2015-05-24 MED ORDER — LACTATED RINGERS IV SOLN
INTRAVENOUS | Status: DC | PRN
  Administered 2015-05-24: 08:00:00 via INTRAVENOUS

## 2015-05-24 MED ORDER — ROCURONIUM BROMIDE 100 MG/10ML IV SOLN
INTRAVENOUS | Status: DC | PRN
  Administered 2015-05-24: 10 mg via INTRAVENOUS
  Administered 2015-05-24: 50 mg via INTRAVENOUS
  Administered 2015-05-24: 20 mg via INTRAVENOUS

## 2015-05-24 MED ORDER — ROCURONIUM BROMIDE 100 MG/10ML IV SOLN
INTRAVENOUS | Status: AC
  Filled 2015-05-24: qty 1

## 2015-05-24 MED ORDER — MAGNESIUM HYDROXIDE 400 MG/5ML PO SUSP
30.0000 mL | Freq: Three times a day (TID) | ORAL | Status: DC
  Administered 2015-05-24: 30 mL via ORAL
  Filled 2015-05-24 (×2): qty 30

## 2015-05-24 MED ORDER — ONDANSETRON HCL 4 MG PO TABS: 4.0000 mg | ORAL_TABLET | Freq: Four times a day (QID) | ORAL | Status: DC | PRN

## 2015-05-24 MED ORDER — LIDOCAINE HCL (CARDIAC) 20 MG/ML IV SOLN
INTRAVENOUS | Status: AC
  Filled 2015-05-24: qty 5

## 2015-05-24 MED ORDER — ONDANSETRON HCL 4 MG/2ML IJ SOLN
INTRAMUSCULAR | Status: DC | PRN
  Administered 2015-05-24: 4 mg via INTRAVENOUS

## 2015-05-24 MED ORDER — PROPOFOL 10 MG/ML IV BOLUS
INTRAVENOUS | Status: AC
  Filled 2015-05-24: qty 20

## 2015-05-24 MED ORDER — DEXAMETHASONE SODIUM PHOSPHATE 10 MG/ML IJ SOLN
INTRAMUSCULAR | Status: DC | PRN
  Administered 2015-05-24: 10 mg via INTRAVENOUS

## 2015-05-24 SURGICAL SUPPLY — 48 items
ATTRACTOMAT 16X20 MAGNETIC DRP (DRAPES) ×3 IMPLANT
BLADE 10 SAFETY STRL DISP (BLADE) ×3 IMPLANT
BLADE EXTENDED COATED 6.5IN (ELECTRODE) ×3 IMPLANT
CELLS DAT CNTRL 66122 CELL SVR (MISCELLANEOUS) IMPLANT
CHLORAPREP W/TINT 26ML (MISCELLANEOUS) ×3 IMPLANT
CLIP TI LARGE 6 (CLIP) ×3 IMPLANT
CLIP TI MEDIUM 6 (CLIP) ×3 IMPLANT
CLIP TI MEDIUM LARGE 6 (CLIP) ×3 IMPLANT
CONT SPEC 4OZ CLIKSEAL STRL BL (MISCELLANEOUS) IMPLANT
COVER SURGICAL LIGHT HANDLE (MISCELLANEOUS) ×3 IMPLANT
DRAPE INCISE IOBAN 66X45 STRL (DRAPES) IMPLANT
DRAPE WARM FLUID 44X44 (DRAPE) ×3 IMPLANT
DRSG OPSITE POSTOP 4X8 (GAUZE/BANDAGES/DRESSINGS) ×3 IMPLANT
ELECT LIGASURE SHORT 9 REUSE (ELECTRODE) IMPLANT
ELECT REM PT RETURN 9FT ADLT (ELECTROSURGICAL) ×3
ELECTRODE REM PT RTRN 9FT ADLT (ELECTROSURGICAL) ×2 IMPLANT
GAUZE SPONGE 4X4 16PLY XRAY LF (GAUZE/BANDAGES/DRESSINGS) IMPLANT
GLOVE BIO SURGEON STRL SZ 6 (GLOVE) ×6 IMPLANT
GLOVE BIO SURGEON STRL SZ 6.5 (GLOVE) ×6 IMPLANT
GOWN STRL REUS W/ TWL LRG LVL3 (GOWN DISPOSABLE) ×4 IMPLANT
GOWN STRL REUS W/TWL LRG LVL3 (GOWN DISPOSABLE) ×2
KIT BASIN OR (CUSTOM PROCEDURE TRAY) ×3 IMPLANT
LIGASURE IMPACT 36 18CM CVD LR (INSTRUMENTS) ×3 IMPLANT
LIQUID BAND (GAUZE/BANDAGES/DRESSINGS) ×3 IMPLANT
LOOP VESSEL MAXI BLUE (MISCELLANEOUS) ×3 IMPLANT
NEEDLE BLUNT 17GA (NEEDLE) ×9 IMPLANT
NEEDLE HYPO 22GX1.5 SAFETY (NEEDLE) ×6 IMPLANT
NS IRRIG 1000ML POUR BTL (IV SOLUTION) ×6 IMPLANT
PACK GENERAL/GYN (CUSTOM PROCEDURE TRAY) ×3 IMPLANT
RETRACTOR WND ALEXIS 25 LRG (MISCELLANEOUS) IMPLANT
RTRCTR WOUND ALEXIS 18CM MED (MISCELLANEOUS)
RTRCTR WOUND ALEXIS 25CM LRG (MISCELLANEOUS)
SHEET LAVH (DRAPES) ×6 IMPLANT
SPONGE LAP 18X18 X RAY DECT (DISPOSABLE) ×3 IMPLANT
STAPLER VISISTAT 35W (STAPLE) IMPLANT
SUT MNCRL AB 4-0 PS2 18 (SUTURE) ×6 IMPLANT
SUT PDS AB 1 TP1 96 (SUTURE) ×6 IMPLANT
SUT VIC AB 0 CT1 36 (SUTURE) ×9 IMPLANT
SUT VIC AB 2-0 CT1 36 (SUTURE) ×9 IMPLANT
SUT VIC AB 2-0 CT2 27 (SUTURE) ×24 IMPLANT
SUT VIC AB 3-0 CTX 36 (SUTURE) IMPLANT
SUT VIC AB 3-0 SH 27 (SUTURE) ×1
SUT VIC AB 3-0 SH 27X BRD (SUTURE) ×2 IMPLANT
SYR 50ML LL SCALE MARK (SYRINGE) ×6 IMPLANT
TOWEL OR 17X26 10 PK STRL BLUE (TOWEL DISPOSABLE) ×3 IMPLANT
TOWEL OR NON WOVEN STRL DISP B (DISPOSABLE) ×3 IMPLANT
TRAY FOLEY W/METER SILVER 14FR (SET/KITS/TRAYS/PACK) ×3 IMPLANT
UNDERPAD 30X30 INCONTINENT (UNDERPADS AND DIAPERS) ×3 IMPLANT

## 2015-05-24 NOTE — Transfer of Care (Signed)
Immediate Anesthesia Transfer of Care Note  Patient: [name]  Procedure(s) Performed: Procedure(s): EXPLORATORY LAPAROTOMY (N/A) TOTAL HYSTERECTOMY ABDOMINAL WITH UTERUS GREATER THAN 250 GRAMS WITH BILATERAL  SALPINGECTOMY (Bilateral)  Patient Location: PACU  Anesthesia Type:General  Level of Consciousness:  sedated, patient cooperative and responds to stimulation  Airway & Oxygen Therapy:Patient Spontanous Breathing and Patient connected to face mask oxgen  Post-op Assessment:  Report given to PACU RN and Post -op Vital signs reviewed and stable  Post vital signs:  Reviewed and stable  Last Vitals:  Filed Vitals:   05/24/15 0540  BP: 126/77  Pulse: 84  Temp: 36.7 C  Resp: 16    Complications: No apparent anesthesia complications

## 2015-05-24 NOTE — Anesthesia Preprocedure Evaluation (Addendum)
Anesthesia Evaluation  Patient identified by MRN, date of birth, ID band Patient awake    Reviewed: Allergy & Precautions, NPO status , Patient's Chart, lab work & pertinent test results  Airway Mallampati: II  TM Distance: >3 FB Neck ROM: Full    Dental no notable dental hx.    Pulmonary neg pulmonary ROS,    Pulmonary exam normal breath sounds clear to auscultation       Cardiovascular + DVT  Normal cardiovascular exam Rhythm:Regular Rate:Normal     Neuro/Psych negative neurological ROS  negative psych ROS   GI/Hepatic negative GI ROS, Neg liver ROS,   Endo/Other  negative endocrine ROS  Renal/GU negative Renal ROS  negative genitourinary   Musculoskeletal negative musculoskeletal ROS (+)   Abdominal   Peds negative pediatric ROS (+)  Hematology Lupus anticoagulant positive   Anesthesia Other Findings   Reproductive/Obstetrics negative OB ROS                            Anesthesia Physical Anesthesia Plan  ASA: II  Anesthesia Plan: General   Post-op Pain Management:    Induction: Intravenous  Airway Management Planned: Oral ETT  Additional Equipment:   Intra-op Plan:   Post-operative Plan: Extubation in OR  Informed Consent: I have reviewed the patients History and Physical, chart, labs and discussed the procedure including the risks, benefits and alternatives for the proposed anesthesia with the patient or authorized representative who has indicated his/her understanding and acceptance.   Dental advisory given  Plan Discussed with: CRNA  Anesthesia Plan Comments:         Anesthesia Quick Evaluation

## 2015-05-24 NOTE — Anesthesia Postprocedure Evaluation (Signed)
Anesthesia Post Note  Patient: ORQUIDIA FRYDRYCH  Procedure(s) Performed: Procedure(s) (LRB): EXPLORATORY LAPAROTOMY (N/A) TOTAL HYSTERECTOMY ABDOMINAL WITH UTERUS GREATER THAN 250 GRAMS WITH BILATERAL  SALPINGECTOMY (Bilateral)  Patient location during evaluation: PACU Anesthesia Type: General Level of consciousness: awake and alert Pain management: pain level controlled Vital Signs Assessment: post-procedure vital signs reviewed and stable Respiratory status: spontaneous breathing, nonlabored ventilation, respiratory function stable and patient connected to nasal cannula oxygen Cardiovascular status: blood pressure returned to baseline and stable Postop Assessment: no signs of nausea or vomiting Anesthetic complications: no    Last Vitals:  Filed Vitals:   05/24/15 1146 05/24/15 1249  BP: 112/69 113/67  Pulse: 70 79  Temp: 36.9 C 37 C  Resp: 14 14    Last Pain:  Filed Vitals:   05/24/15 1325  PainSc: 3                  Montez Hageman

## 2015-05-24 NOTE — H&P (Signed)
Consult was initially requested by Dr. Valentino Saxon for the evaluation of [name] 35 y.o. female with a 30 week fibroid uterus and acute RLE DVT.  CC:  Chief Complaint  Patient presents with  . Fibroids    MD follow up    Assessment/Plan:  [name] is a 35 y.o. 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 Due to IVC compression she is not a candidate for IVC filter placement.  Her RLE edema has substantially improved since her anticoagulant therapy. Her vaginal bleeding is substantially improved on aygestin (she has had no further bleeding).  We have scheduled hysterectomy for April 6th, 2017 when she is approximately 4 months s/p DVT. She should continue Aygestin preop.  We will have her stop her xarelto 2 weeks preop and bridge her with Lovenox (which has a shorter half life). Her last dose will be the night before surgery. She is at high risk for high blood loss and we discussed this. We will faciliate a cell saver for autotransfusion and type and cross her for 2 units.  I discussed operative risks with [name] which are elevated for her due to the size of her uterus and her recent anticoagulation and her prior myomectomy. These include bleeding, infection, damage to internal organs (such as bladder,ureters, bowels), blood clot, reoperation and rehospitalization.   HPI: [name] is a 35 year old G0 who is seen in consultation at the request of Dr Valentino Saxon for an enlarged uterus with fibroids and acute RLE DVT.  Fibroids. She has undergone a abdominal myomectomy in 2009 which have controlled her symptoms of menorrhagia. She then took oral contraceptive pills for many years. However in a proximally June 2016 she began experiencing intermittent vaginal bleeding again. And was reevaluated for possibly a hysterectomy. However in November 2016 she developed symptoms of right lower extremity edema. She was  admitted to was a local hospital for evaluation and an extensive right-sided lower extremity and pelvic DVT including the right common iliac vein was noted. This was felt to be secondary to compression of the fibroid uterus on the infrarenal IVC. Due to occlusion of the IVC to the level of the infrarenal level there was a failed attempt at IVC filter placement and therefore thrombectomy and IVC filter placement was attempted but aborted. Instead she was treated with anticoagulant therapy with Lovenox.  Her gynecologist is Dr. Pamala Hurry. Dr. Valentino Saxon evaluated the patient as an inpatient and a plan was made to treat with anticoagulant therapy with an interval hysterectomy. The patient was treated with Aygestin to control her bleeding symptoms. This immediately resulted in resolution of vaginal bleeding to all but intermittent vaginal spotting. Since starting Lovenox on 01/07/2015 she has noted substantial improvement in her right lower extremity edema which she feels is about 50% of what it was at the time of presentation.  Interval Hx: She has changed anticoagulation to xarelto (from lovenox). Edema in her LE has continued to diminish. She has had no vaginal bleeding on aygestin. She has minimal bulk side effects from her fibroid uterus.   Current Meds:  Outpatient Encounter Prescriptions as of 04/23/2015  Medication Sig  . LUPRON DEPOT 11.25 MG injection   . Multiple Vitamin (MULTIVITAMIN) tablet Take 1 tablet by mouth daily.  . norethindrone (AYGESTIN) 5 MG tablet TAKE 1 TABLET BY MOUTH EVERY DAY -START 2 WEEKS PRIOR TO LUPRON, STOP BIRTH CONTROL PILL WHEN START*  . Omega-3 Fatty Acids (  FISH OIL PO) Take 1 capsule by mouth daily.  . rivaroxaban (XARELTO) 20 MG TABS tablet Take 1 tablet (20 mg total) by mouth daily with supper.  . [DISCONTINUED] enoxaparin (LOVENOX) 80 MG/0.8ML injection Inject 0.8 mLs (80 mg total) into the skin every 12 (twelve) hours.   No  facility-administered encounter medications on file as of 04/23/2015.    Allergy: No Known Allergies  Social Hx:  Social History   Social History  . Marital Status: Married    Spouse Name: N/A  . Number of Children: N/A  . Years of Education: N/A   Occupational History  . Not on file.   Social History Main Topics  . Smoking status: Never Smoker   . Smokeless tobacco: Not on file  . Alcohol Use: No  . Drug Use: No  . Sexual Activity: Yes   Other Topics Concern  . Not on file   Social History Narrative    Past Surgical Hx:  Past Surgical History  Procedure Laterality Date  . Uterine fibroid surgery  2009    Dr. Rosana Hoes    Past Medical Hx:  Past Medical History  Diagnosis Date  . Fibroid, uterine     Past Gynecological History: Abdominal myomectomy 2009 No LMP recorded.  Family Hx:  Family History  Problem Relation Age of Onset  . Hypertension Father   . Hyperlipidemia Father   . Diabetes Father   . Cancer Maternal Grandmother     Endometrial CA  . Hyperlipidemia Paternal Grandmother     Review of Systems:  Constitutional  Feels well,  ENT Normal appearing ears and nares bilaterally Skin/Breast  No rash, sores, jaundice, itching, dryness Cardiovascular  No chest pain, shortness of breath, or edema  Pulmonary  No cough or wheeze.  Gastro Intestinal  No nausea, vomitting, or diarrhoea. No bright red blood per rectum, no abdominal pain, change in bowel movement, or constipation.  Genito Urinary  No frequency, urgency, dysuria, + intermenstrual spotting Musculo Skeletal  No myalgia, arthralgia, joint swelling or pain  Neurologic  No weakness, numbness, change in gait,  Psychology  No depression, anxiety, insomnia.   Vitals: Blood pressure 131/90, pulse 96, temperature 98.9 F (37.2 C), temperature source Oral, resp. rate 18, height  5\' 5"  (1.651 m), weight 148 lb 6.4 oz (67.314 kg), SpO2 100 %.  CBC    Component Value Date/Time   WBC 7.5 05/16/2015 1533   WBC 8.5 01/07/2015 0351   RBC 4.60 05/16/2015 1533   RBC 3.72* 01/07/2015 0351   HGB 14.7 05/16/2015 1533   HGB 11.6* 01/07/2015 0351   HCT 42.9 05/16/2015 1533   HCT 35.8* 01/07/2015 0351   PLT 247 05/16/2015 1533   PLT 233 01/07/2015 0351   MCV 93.3 05/16/2015 1533   MCV 96.2 01/07/2015 0351   MCH 32.0 05/16/2015 1533   MCH 31.2 01/07/2015 0351   MCHC 34.3 05/16/2015 1533   MCHC 32.4 01/07/2015 0351   RDW 12.9 05/16/2015 1533   RDW 13.0 01/07/2015 0351   LYMPHSABS 3.2 05/16/2015 1533   LYMPHSABS 1.6 01/05/2015 1204   MONOABS 0.6 05/16/2015 1533   MONOABS 1.0 01/05/2015 1204   EOSABS 0.1 05/16/2015 1533   EOSABS 0.1 01/05/2015 1204   BASOSABS 0.0 05/16/2015 1533   BASOSABS 0.0 01/05/2015 1204     Physical Exam: WD in NAD Neck  Supple NROM, without any enlargements.  Lymph Node Survey No cervical supraclavicular or inguinal adenopathy Cardiovascular  Pulse normal rate, regularity and rhythm. S1 and  S2 normal.  Lungs  Clear to auscultation bilateraly, without wheezes/crackles/rhonchi. Good air movement.  Skin  No rash/lesions/breakdown  Psychiatry  Alert and oriented to person, place, and time  Abdomen  Normoactive bowel sounds, abdomen soft, non-tender and thin without evidence of hernia. Distended to 1 hands and below the xiphoid process with a  Back No CVA tenderness Genito Urinary  Vulva/vagina: Normal external female genitalia. No lesions. No discharge or bleeding. Bladder/urethra: No lesions or masses, well supported bladder Vagina: normal Cervix: Normal appearing, no lesions. Uterus: Bulky, large, minimally mobile, spans from left to right side.  Adnexa: no discrete masses. Rectal  Good tone, no masses no cul de sac nodularity.  Extremities  No  bilateral cyanosis, clubbing or edema.   Donaciano Eva, MD

## 2015-05-24 NOTE — Anesthesia Procedure Notes (Signed)
Procedure Name: Intubation Date/Time: 05/24/2015 7:33 AM Performed by: Lajuana Carry E Pre-anesthesia Checklist: Patient identified, Emergency Drugs available, Suction available and Patient being monitored Patient Re-evaluated:Patient Re-evaluated prior to inductionOxygen Delivery Method: Circle System Utilized Preoxygenation: Pre-oxygenation with 100% oxygen Intubation Type: IV induction Ventilation: Mask ventilation without difficulty Laryngoscope Size: Miller and 2 Grade View: Grade I Tube type: Oral Tube size: 7.0 mm Number of attempts: 1 Airway Equipment and Method: Stylet Placement Confirmation: ETT inserted through vocal cords under direct vision,  positive ETCO2 and breath sounds checked- equal and bilateral Secured at: 21 cm Tube secured with: Tape Dental Injury: Teeth and Oropharynx as per pre-operative assessment

## 2015-05-24 NOTE — Op Note (Signed)
PATIENT: [name] DATE: 05/24/15   Preop Diagnosis: Symptomatic uterine fibroids, IVC compression and DVT  Postoperative Diagnosis: same  Surgery: Total abdominal hysterectomy uterus >250gm, bilateral salpingectomy  Surgeons:  Everitt Amber, MD; Lahoma Crocker, MD   Anesthesia: General   Estimated blood loss: 500 ml  IVF: 2000 ml   Urine output: [id number] ml   Complications: None   Pathology: Uterus, cervix, bilateral tubes  Operative findings: 25cm fibroid uterus broadly filling pelvis from left to right, minimally mobile, grossly normal appearing ovaries and tubes (left paratubal cyst).   Procedure: The patient was identified in the preoperative holding area. Informed consent was signed on the chart. Patient was seen history was reviewed and exam was performed.   The patient was then taken to the operating room and placed in the supine position with SCD hose on. General anesthesia was then induced without difficulty. She was then placed in the dorsolithotomy position. The abdomen was prepped with chlor prep sponges per protocol. Perineum was prepped with Betadine. The vagina was prepped with Betadine a Foley catheter was inserted into the bladder under sterile conditions.  The patient was then draped after the prep was dried. Timeout was performed the patient, procedure, antibiotic, allergy, and length of procedure. A vertical midline infraumbilical incision was and carried down to the underlying fascia using Bovie cautery. The fascia was scored in the fascial incision was extended superiorly and inferiorly using Bovie cautery. The rectus bellies were dissected off the overlying fascia. The peritoneum was tented and entered. The peritoneal incision was extended superiorly and inferiorly with visualization of the underlying peritoneal cavity. The Buchwalter self-retaining retractor was then placed. At the initial placement as well as at several points during the case the  lateral blades were checked to ensure no significant pressure on the psoas bellies.  The small and large bowel were packed out of the way of the surgical field with moist laparotomy sponges and malleable retractors were attached to the Elysian. The round ligament on the patient's right side was transected with monopolar cautery the anterior posterior leaves the broad ligament were opened. The right ureter was identified deep in the right retroperitoneum. A window was made under the utero-ovarian vessels and the ureter. The vessels were clamped, sealed and transected with the ligasure. The posterior peritoneum was carefully dropped off of the back of the uterine wall to mobilize the ovary and its vascular pedicles off of the uterus. The right fallopian tube was separated from the right ovary using ligasure. When the posterior peritoneum had been completely mobilized off of the back of the uterus, the bladder flap was created using monopoloar bovie through the peritoneum and sharp and blunt dissection to mobilize the bladder to beyond the cervicovaginal junction. This skeletonized the uterine vessels on the right side were skeletonized. The uterine vessels were very broad and were clamped with ligasure to bipolar seal and transected. The uterine vessels most close to the isthmus were then clamped with haeney clamps, cut and sutured.   On the left side a similar procedure was performed in that the round ligament was transected and the anterior and posterior leaves the broad ligament were opened to expose the ureter deep in the pararectal space. A window was made under the utero-ovarian vessels and the utero-ovarian ligament was sealed with ligasure and transected. The left fallopian tube and its paratubal cyst was separated from the ovary with ligasure dissection (it remained with the uterine specimen). The left ovarian vessels were  skeletonized from the uterus. The bladder flap was completed anteriorally. This  skeletonized the uterine vessels on the left. The uterine vessels were skeletonized clamped x2 transected and suture ligated. We continued down the cardinal ligaments with straight Haeney clamps the cervicovaginal junction was reached. We came across the vagina just under the cervix with curved Haeney clamps. The cervix was amputated from the vagina. The angle pedicles as well as vaginal cuff were closed using figure-of-eight sutures of 0 Vicryl.  The pedicles were noted to be hemostatic. The abdomen pelvis were copiously irrigated. The retractor and laparotomy sponges were removed. The fascia was closed using running mass closure of #1 PDS. The subcutaneous tissues were irrigated and made hemostatic. 20 mL of Exparel within 20 mL of normal saline and 20 mL of marcaine quarter percent was injected for postoperative pain control. The skin was closed using subcuticular suture.  All instrument, suture, laparotomy, Ray-Tec, and needle counts were correct x2. The patient tolerated the procedure well and was taken recovery room in stable condition. This is Everitt Amber dictating an operative note on [name].

## 2015-05-25 LAB — BASIC METABOLIC PANEL
Anion gap: 8 (ref 5–15)
BUN: 8 mg/dL (ref 6–20)
CHLORIDE: 107 mmol/L (ref 101–111)
CO2: 26 mmol/L (ref 22–32)
Calcium: 8.7 mg/dL — ABNORMAL LOW (ref 8.9–10.3)
Creatinine, Ser: 0.66 mg/dL (ref 0.44–1.00)
GFR calc Af Amer: >60 mL/min (ref >60)
GFR calc non Af Amer: >60 mL/min (ref >60)
GLUCOSE: 128 mg/dL — AB (ref 65–99)
POTASSIUM: 3.9 mmol/L (ref 3.5–5.1)
SODIUM: 141 mmol/L (ref 135–145)

## 2015-05-25 LAB — CBC
HEMATOCRIT: 35 % — AB (ref 36.0–46.0)
HEMOGLOBIN: 12.3 g/dL (ref 12.0–15.0)
MCH: 32.6 pg (ref 26.0–34.0)
MCHC: 35.1 g/dL (ref 30.0–36.0)
MCV: 92.8 fL (ref 78.0–100.0)
Platelets: 207 10*3/uL (ref 150–400)
RBC: 3.77 MIL/uL — AB (ref 3.87–5.11)
RDW: 12.9 % (ref 11.5–15.5)
WBC: 7 10*3/uL (ref 4.0–10.5)

## 2015-05-25 NOTE — Progress Notes (Signed)
1 Day Post-Op Procedure(s) (LRB): EXPLORATORY LAPAROTOMY (N/A) TOTAL HYSTERECTOMY ABDOMINAL WITH UTERUS GREATER THAN 250 GRAMS WITH BILATERAL  SALPINGECTOMY (Bilateral)  Subjective: Patient reports improved pain, + flatus, but also some nausea. Poor appetite.    Objective: Vital signs in last 24 hours: Temp:  [97.9 F (36.6 C)-99.1 F (37.3 C)] 99.1 F (37.3 C) (04/07 0558) Pulse Rate:  [70-79] 79 (04/07 0558) Resp:  [14-16] 14 (04/07 0558) BP: (101-122)/(62-79) 101/66 mmHg (04/07 0558) SpO2:  [97 %-100 %] 100 % (04/07 0558)    Intake/Output from previous day: 04/06 0701 - 04/07 0700 In: 2446.7 [I.V.:2446.7] Out: 2625 [Urine:2125; Blood:500]  Physical Examination: General: alert and cooperative Resp: clear to auscultation bilaterally Cardio: regular rate and rhythm, S1, S2 normal, no murmur, click, rub or gallop GI: soft, non-tender; bowel sounds normal; no masses,  no organomegaly and incision: clean, dry, intact and with honeycomb dressing Extremities: extremities normal, atraumatic, no cyanosis or edema Vaginal Bleeding: minimal spotting only  Labs: WBC/Hgb/Hct/Plts:  7.0/12.3/35.0/207 (04/07 0750) BUN/Cr/glu/ALT/AST/amyl/lip:  8/0.66/--/--/--/--/-- (04/07 0750)   Assessment:  35 y.o. s/p Procedure(s): EXPLORATORY LAPAROTOMY TOTAL HYSTERECTOMY ABDOMINAL WITH UTERUS GREATER THAN 250 GRAMS WITH BILATERAL  SALPINGECTOMY: stable Pain:  Pain is well-controlled on oral medications.  Heme: normal and appropriate Hb on POD 1 consistent with intraop blood loss (500cc). Will recheck CBC on POD 2. If remains stable with no signs of active bleeding, will change lovenox dosing to 1mg /kg BID (continue prophylactic dosing for now).  CV: no issues.  GI:  Tolerating po: Yes   Pt with some delay in bowel function with nausea - recommended to go slow on diet. Cont IVF at low rate until tolerating more PO.  GU: marginal UO (1.1cc/kg/hr). Recommend continue IVF at low rate until  tolerating more PO. Creatinine normal.  Prophylaxis: pharmacologic prophylaxis (with any of the following: enoxaparin (Lovenox) 40mg  SQ 2 hours prior to surgery then every day). Will change to 1mg /kg BID at POD 2 if Hb stable (patient has hx of DVT 3 months ago). She will need to be discharged home on therapeutic lovenox . Will change to xarelto when I see her back in office for postop check.  Plan: Advance diet Encourage ambulation Advance to PO medication Dispo:  Discharge plan to include :consults: @CM @, Social Work The patient is to be discharged to home likely on POD 2 or 3 pending bowel function.   LOS: 1 day    Donaciano Eva 05/25/2015, 8:43 AM

## 2015-05-26 LAB — CBC
HEMATOCRIT: 29.7 % — AB (ref 36.0–46.0)
Hemoglobin: 10.3 g/dL — ABNORMAL LOW (ref 12.0–15.0)
MCH: 32.6 pg (ref 26.0–34.0)
MCHC: 34.7 g/dL (ref 30.0–36.0)
MCV: 94 fL (ref 78.0–100.0)
Platelets: 192 10*3/uL (ref 150–400)
RBC: 3.16 MIL/uL — ABNORMAL LOW (ref 3.87–5.11)
RDW: 13 % (ref 11.5–15.5)
WBC: 6.5 10*3/uL (ref 4.0–10.5)

## 2015-05-26 MED ORDER — ENOXAPARIN SODIUM 80 MG/0.8ML ~~LOC~~ SOLN: 65.0000 mg | Freq: Two times a day (BID) | SUBCUTANEOUS | Status: DC

## 2015-05-26 NOTE — Discharge Summary (Signed)
Physician Discharge Summary  Patient ID: [name] MRN: [id number] DOB/AGE: Aug 05, 1980 35 y.o.  Admit date: 05/24/2015 Discharge date: 05/26/2015  Admission Diagnoses: Fibroid uterus  Discharge Diagnoses:  Principal Problem:   Fibroid uterus Active Problems:   Uterine fibroid   Discharged Condition: good  Hospital Course: On 05/24/2015, the patient underwent the following: Procedure(s): EXPLORATORY LAPAROTOMY TOTAL HYSTERECTOMY ABDOMINAL WITH UTERUS GREATER THAN 250 GRAMS WITH BILATERAL  SALPINGECTOMY.   The postoperative course was uneventful.  She was discharged to home on postoperative day 2 tolerating a regular diet.  Consults: None  Significant Diagnostic Studies: labs: cbc/bmet  Treatments: IV hydration and surgery: see above  Discharge Exam: Blood pressure 109/78, pulse 103, temperature 98.5 F (36.9 C), temperature source Oral, resp. rate 16, height 5\' 5"  (1.651 m), weight 145 lb (65.772 kg), last menstrual period 12/24/2014, SpO2 100 %. General appearance: alert Resp: clear to auscultation bilaterally Cardio: regular rate and rhythm, S1, S2 normal, no murmur, click, rub or gallop GI: soft, non-tender; bowel sounds normal; no masses,  no organomegaly Pelvic: scant heme Extremities: extremities normal, atraumatic, no cyanosis or edema Incision/Wound:C/D/I  Disposition: 01-Home or Self Care  Discharge Instructions    Activity as tolerated - No restrictions    Complete by:  As directed      Call MD for:  extreme fatigue    Complete by:  As directed      Call MD for:  persistant dizziness or light-headedness    Complete by:  As directed      Call MD for:  persistant nausea and vomiting    Complete by:  As directed      Call MD for:  redness, tenderness, or signs of infection (pain, swelling, redness, odor or green/yellow discharge around incision site)    Complete by:  As directed      Call MD for:  severe uncontrolled pain    Complete by:  As directed     Call MD for:  temperature >100.4    Complete by:  As directed      Diet general    Complete by:  As directed      Discharge instructions    Complete by:  As directed   Activity: 1. Be up and out of the bed during the day.  Take a nap if needed.  You may walk up steps but be careful and use the hand rail.  Stair climbing will tire you more than you think, you may need to stop part way and rest.   2. No lifting or straining for 6 weeks.  3. No driving for 1-2 weeks.  Do Not drive if you are taking narcotic pain medicine.  4. Shower daily.  Use soap and water on your incision and pat dry; don't rub.   5. No sexual activity and nothing in the vagina for 4 weeks.  Diet: 1. Low sodium Heart Healthy Diet is recommended.  2. It is safe to use a laxative if you have difficulty moving your bowels.   Wound Care: 1. Keep clean and dry.  Shower daily.  Reasons to call the Doctor:  Fever - Oral temperature greater than 100.4 degrees Fahrenheit Foul-smelling vaginal discharge Difficulty urinating Nausea and vomiting Increased pain at the site of the incision that is unrelieved with pain medicine. Difficulty breathing with or without chest pain New calf pain especially if only on one side Sudden, continuing increased vaginal bleeding with or without clots.   Follow-up: 1. See Terrence Dupont  Rossi in 4 weeks.  Contacts: For questions or concerns you should contact:  Dr. Everitt Amber at [phone]  or at [place]     Discharge wound care:    Complete by:  As directed   Keep clean and dry     Driving Restrictions    Complete by:  As directed   No driving for 1- 2 weeks     Increase activity slowly    Complete by:  As directed      Lifting restrictions    Complete by:  As directed   No lifting > 5 lbs for 6 weeks     May shower / Bathe    Complete by:  As directed   No tub baths for 6 weeks     May walk up steps    Complete by:  As directed      Sexual Activity  Restrictions    Complete by:  As directed   No intercourse for 6 - 8 weeks            Medication List    STOP taking these medications        norethindrone 5 MG tablet  Commonly known as:  AYGESTIN     rivaroxaban 20 MG Tabs tablet  Commonly known as:  XARELTO      TAKE these medications        enoxaparin 80 MG/0.8ML injection  Commonly known as:  LOVENOX  Inject 0.65 mLs (65 mg total) into the skin every 12 (twelve) hours.     multivitamin tablet  Take 1 tablet by mouth daily.           Follow-up Information    Follow up with Donaciano Eva, MD On 06/08/2015.   Specialty:  Obstetrics and Gynecology   Why:  at 2:45pm at the [facility] information:   [address] [address] [phone]       Follow up with Donaciano Eva, MD. Schedule an appointment as soon as possible for a visit today.   Specialty:  Obstetrics and Gynecology   Why:  postop f/u   Contact information:   [place]  [address] [phone]       Signed: Agnes Lawrence 05/26/2015, 9:52 AM

## 2015-05-26 NOTE — Progress Notes (Signed)
Nurse reviewed discharge instructions with pt.  Pt verbalized understanding of discharge instructions, follow up appointments and new medication.  Pt stated she was comfortable with giving lovenox injections to herself as she had done so prior to surgery.  No concerns at time of discharge.

## 2015-05-26 NOTE — Discharge Instructions (Signed)
Planning for Recovery and Going Home Your Guide to Gynecologic Surgery     In-Hospital Recovery Plan Team Caring for You After Surgery In addition to the nursing staff on the unit, the gynecological surgery team will care for you. This team is led by your surgeon and includes a resident in his last year of training, as well as other residents, medical students and a physician assistant or nurse practitioner. There will be a physician in the hospital 24 hours a day to tend to your needs. The residents and students report directly to your surgeon, who is the one overseeing all of your care.  Pain Relief After Surgery Your pain will be assessed regularly on a scale from 0 to 10. Pain assessment is  necessary to guide your pain relief. It is essential that you are able to take deep breaths, cough and move. Prevention or early treatment of pain is far more effective than trying to treat severe pain. Therefore, we have devised a specialized regimen to stay ahead of your pain and use almost no narcotics, which can slow down your recovery process. If you have an epidural catheter, you will receive a  constant infusion of pain medication through your epidural. If you need additional pain relief, you will be able to push a button to increase the medication in your epidural. You will also be given acetaminophen and an ibuprofen-like medication to keep your pain under control.  You can always ask for additional pain pills if you are not comfortable. In most cases an anesthesiologist with expertise in pain management will visit you every day and help design your pain management plan.  One Day After Surgery Focus on drinking and walking. You will start drinking clear liquids after surgery. The intravenous fluids will be stopped, and the catheter may be removed  from your bladder. We expect you to get out of bed, with the nurses' or assistants' help, sit in a chair for six hours and start to move  about in the hallways. You will also meet with a case manager to assess your discharge needs, including home nursing. Your physician may order home care to assist with your transition home.  Home nursing visits, which are intermittent, help you get readjusted to home by teaching treatments, monitoring medications, and performing clinical assessment and reporting back to your physician. Other services may include therapy and medical equipment; private duty services are also available. If you are going "home" to a different address upon discharge, please alert [place]. A Home Care Coordinator can visit with you while in the hospital to discuss your options. If you have questions please speak with your case manager. If you need rehabilitation at a facility, a social worker will assist with this. If you need rehabilitation at a facility, a social worker will assist with this. If your procedure was performed in a minimally invasive fashion, you will be discharged to home if your pain is well controlled and you are tolerating a regular diet.     Two Days After Surgery You will start eating a soft diet and change to a more solid diet as you feel up to it. The catheter from your bladder will be removed, if not already done so. If there is a dressing on your wound, it will be removed. The tubing will be disconnected from your IV. We expect you to be out of bed for the majority of the day and walking at least three times in the hallway, with assistance as  needed.  You may be discharged at this point if it is felt you are ready.   Three Days After Surgery You continue to eat your low residue diet. You may be ready to go home if you are drinking enough to keep yourself hydrated, your pain is well controlled, you are not belching or nauseated, you are passing gas and you are able to get around on your own. However, we will not discharge you from the hospital until we are sure you  are ready.  Discharge Discharge time is at 10 a.m. You will need to make arrangements for someone to accompany you home. You will not be released without someone present. Please keep in mind that we strive to get patients discharged as quickly as possible, but there may be delays for a variety of reasons. Complications That May Delay Discharge: ? Nausea and vomiting: It is very common to feel sick after your surgery. We give you medication to reduce this. However, if you do feel sick, you should reduce the amount you are taking by mouth. Small, frequent meals or drinks are best in this  situation. As long as you can drink and keep yourself hydrated, the nausea will likely pass.  Ileus: Following surgery, the bowel can be sluggish, making it difficult for food and gas to pass through the intestines. This is called an ileus. We have designed our care program to do everything possible to reduce the likelihood of an ileus. If you do develop an ileus, it usually only lasts two to three days. However, it may require a small tube down the nose to decompress the stomach. The best way to avoid an ileus is to reduce the amount of narcotic pain medications, get up as much as possible after your surgery, and stimulate the bowel early after surgery with small amounts of food and liquids.  Wound infection: If a wound infection develops, this usually happens three to ten days after surgery.    Urinary retention: This is if you are unable to urinate after the catheter from your bladder is removed. The catheter may need to be reinserted until you are able to urinate on your own. This can be caused by anesthesia, pain medication and decreased activity.    When you are preparing to go home, you will receive:  Detailed discharge instructions, with information about your operation and medications    All prescriptions for medications you need at home; prescriptions can be filled while you are in the  hospital if you would like    You may be prescribed Lovenox. Lovenox is used to reduce the risk of developing a blood clot after surgery. An appointment to see your surgeon or provider one to two weeks after you leave the hospital for follow-up   After Discharge Once you are discharged: Call us at any time if you are worried about your recovery or if you should have any questions. During regular office hours, (8:30 a.m.-4:30 p.m.), and after hours call [phone].  Call us immediately if:  You have a fever higher than 100.4 degrees.   Your wound is red, more painful or has drainage.    You are nauseated, vomiting or can't keep liquids down.    Your pain is worse and not able to be controlled with the regimen you were sent home with.    If you are bleeding heavily or have a lot of fluid coming from your vagina. If you are on narcotics, the goal is to  wean you off of them. If you are running low on supply and need more, call the nurse a few days before you will run out.  It is generally easier to reach someone between 8:30 a.m. - 4:30 p.m., so call early if you think something is not right. A nurse or nurse practitioner is available every day to answer your questions. After hours and on the weekends, the calls go to the resident doctors in the hospital. It may take longer for your phone call to be returned during this time. If you have a true emergency, such as severe abdominal pain, chest pain, shortness of breath or any other acute issues, call 911 and go to the local emergency room. Have them contact our team once you are stable.  Concerns After Discharge Bowel Function Following Your Surgery Your bowels will take several weeks to settle down and may be unpredictable at first. Your bowel movements may become loose, or you may be constipated. For the vast number of patients, this will get back to normal with time. Make sure you eat nutritious meals, drink plenty of fluids  and take regular walks during the first two weeks after your operation. Your Guide to Gynecologic Surgery    Abdominal Pain It is not unusual to suffer gripping pains (colic) during the first week following removal of a portion of your bowel. This pain usually lasts for a few minutes but goes away between spasms. If you have severe pain lasting more than one to two hours or have a fever and feel generally unwell, you should contact us at  the telephone contact numbers listed at the end of this packet. Hysterectomy: You should have pelvic rest for six (6) weeks or as specified by your doctor after surgery. You should have nothing in the vagina (no tampons, douching, intercourse, etc.,) during this time period. If you have some vaginal spotting, this is normal. If you have heavy bleeding or  a lot of fluid from your vagina, this is NOT normal and you should contact your doctor's office or, if after hours, contact the doctor on call.  Diarrhea: Fiber and Imodium (Loperamide) The first step to improving your frequent or loose stools is to bulk up the stool with fiber. Metamucil is the most common type of fiber that is available at any drug store. Start with 1 teaspoon mixed into food, like yogurt or oatmeal, in the morning and evening. Try not to drink any fluid for one hour after you take the fiber. This will allow the fiber to act like a sponge in your intestines, soaking up all the excess water. Continue this for three to five days. You may increase by 1 teaspoon every three to five days until the desired affect, or you are at 1 tablespoon (3 teaspoons) twice a day. If this doesn't work, you may try over-the-counter Loperamide, which is an antidiarrheal medication. You may take one tablet in the morning and evening or 30 minutes before you typically have diarrhea. You may take up to eight of these tablets daily. It is best to discuss this with [place] prior to using this medication. If you  have continuous diarrhea and abdominal cramping, call [phone].  Foley Catheter Your surgeon may recommend you be discharged home with a foley catheter (bladder catheter) for 1 to 2 weeks. Typically this recommendation will be made for patients undergoing surgery to the lower urinary tract. Before you leave the hospital, your nurse should outfit you with a clip  on the inner thigh to secure the catheter to prevent pulling as well as a small bag that can be easily worn on the upper leg under loose fitting pants and skirts. Your nurse will teach you how to exchange the large bag that typically comes with the catheter for the small bag. You may find it convenient to attach the small bag when active during the day and then the large bag when sleeping at night. If there is ever a point when you notice the catheter is not draining urine and youbegin to develop pain behind/above the pubic bone, you should report to the clinic or emergency room immediately as the catheter may be kinked or clogged. Kinking or clogging of the catheter prevents urine from draining from your bladder. Urine will quickly build up in the bladder and can cause severe pain as well as seriously disrupt healing if you have undergone surgery on the lower urinary tract. Additionally, pulling on the catheter can result in displacement of the balloon at the end of the catheter from inside of to outside  of the bladder. This also results in severe pain and can cause bleeding. For this reason, secure the catheter to the clip on your inner thigh at all times as the clip prevents against pulling.  Wound Care For the first few weeks following surgery, your wound may be slightly red and uncomfortable. You may shower and let the soapy water wash over your incision. Avoid soaking in the tub for one month following surgery or until the wound is well healed. It will take the wound several months to "soften." It is common to have bumpy areas  in the wound near the belly  button and at the ends of the incision.  If you have staples, these should be removed when you are seen by your surgeon at the follow-up appointment. You may have a glue-like material on your incision. Do not pick at this. It will come off over time. It is the surgical glue used in surgery to close your incision. You also have sutures inside of you that will dissolve over time  Post-Surgery Diet Attention to good nutrition after surgery is important to your recovery. If you had no dietary restrictions prior to the surgery, you will have no special dietary restrictions after the surgery. However, consuming enough protein, calories, vitamins and minerals is necessary to support healing. Some patients find their appetite is less than normal after surgery. In this case, frequent small meals throughout the day may help. It is not uncommon to lose 10 to 15 pounds after surgery. However, by the fourth to fifth week, your weight loss should stabilize. It is normal that certain foods taste different and certain smells may make you nauseas. Over time, the amount you can comfortably consume will gradually increase. You should try to eat a balanced diet, which includes:  Foods that are soft, moist, and easy to chew and swallow    Canned or soft-cooked fruits and vegetables   Plenty of soft breads, rice, pasta, potatoes and other starchy foods (lowerfiber  varieties may be tolerated better initially)    High-protein foods and beverages, such as meats, eggs, milk, cottage cheese  or a supplemental nutrition drink like Boost or Ensure    Drink plenty of fluids-at least 8 to 10 cups per day. This includes water,  fruit juice, Gatorade, teas/coffee and milk. Drinking plenty is especially important if you have loose stools (diarrhea).   Avoid drinking a lot of  caffeine, since this may dehydrate you.  ° ° Avoid fried, greasy and highly seasoned or spicy foods.  ° ° Avoid  carbonated beverages in the first couple weeks.  ° ° Avoid raw fruits and vegetables.  ° °Hobbies/Activities °Walking is encouraged after your surgery. You should plan to undertake °regular exercise several times a day and gradually increase this during the °four weeks following your operation until you are back to your normal level °of activity. You may climb stairs. Don't do any heavy lifting greater than 10 °pounds or contact sports for the first month after your surgery. °Generally, you can return to hobbies and activities soon after your surgery. °This will help you recover. °It can take up to two to three months to fully recover. It is not unusual to be °fatigued and require an afternoon nap for up to six to eight weeks following °surgery. Your body is using this energy to heal your wounds. Set small goals °for yourself and try to do a little more each day. ° °Work °It is normal to return to work three to six weeks following your operation. If °your job involves heavy manual work, then you should wait six weeks. °However, you should check with your employer regarding rules, which may °be relevant to your return to work. If you need a return-to-work form for your °employer or disability papers, bring them to your follow-up appointment or °fax them to our office at [phone]. ° °Driving °You may drive when you are off narcotics and pain-free enough to react °quickly with your braking foot. For most patients, this occurs at one to four °weeks following surgery. ° ° °Write down any questions you may have to ask your care team. ° °Important Contact Numbers: °GYN Oncology Office: [phone] °

## 2015-05-28 ENCOUNTER — Telehealth: Payer: Self-pay

## 2015-05-28 LAB — TYPE AND SCREEN
ABO/RH(D): A POS
Antibody Screen: NEGATIVE
Unit division: 0
Unit division: 0

## 2015-05-28 NOTE — Telephone Encounter (Signed)
Orders received from [facility] to contact the patient to update with Surgical Pathology report was "negative" for cancer. Attempted to contact the patient , no answer , left a detailed message with surgical pathology report and to also see how she doing postoperatively . Left our contact information if additional questions arise.

## 2015-06-08 ENCOUNTER — Encounter: Payer: Self-pay | Admitting: Gynecologic Oncology

## 2015-06-08 ENCOUNTER — Ambulatory Visit: Payer: BLUE CROSS/BLUE SHIELD | Attending: Gynecologic Oncology | Admitting: Gynecologic Oncology

## 2015-06-08 VITALS — BP 125/85 | HR 112 | Temp 98.3°F | Resp 18 | Ht 65.0 in | Wt 133.7 lb

## 2015-06-08 DIAGNOSIS — D259 Leiomyoma of uterus, unspecified: Secondary | ICD-10-CM | POA: Diagnosis not present

## 2015-06-08 DIAGNOSIS — Z79899 Other long term (current) drug therapy: Secondary | ICD-10-CM | POA: Insufficient documentation

## 2015-06-08 DIAGNOSIS — Z9889 Other specified postprocedural states: Secondary | ICD-10-CM | POA: Insufficient documentation

## 2015-06-08 DIAGNOSIS — Z90710 Acquired absence of both cervix and uterus: Secondary | ICD-10-CM | POA: Insufficient documentation

## 2015-06-08 DIAGNOSIS — Z7901 Long term (current) use of anticoagulants: Secondary | ICD-10-CM | POA: Diagnosis not present

## 2015-06-08 DIAGNOSIS — Z86718 Personal history of other venous thrombosis and embolism: Secondary | ICD-10-CM

## 2015-06-08 NOTE — Progress Notes (Signed)
Follow-up Note: Gyn-Onc  Consult was initially requested by Dr. Valentino Greer for the evaluation of [name] 35 y.o. female with a 30 week fibroid uterus and acute RLE DVT.  CC:  Chief Complaint  Patient presents with  . Follow-up    postop for fibroids    Assessment/Plan:  [name]  is a 35 y.o.  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   She is doing very well postop with no issues.  I recommend discontinuing therapeutic lovenox and she can restart xarelto as previously described. She may discontinue this at her hematologist's recommendation.  I reviewed return to work plans (in approximately 4 weeks) and ongoing restrictions. She does not require routine vaginal cytology as she has had a complete hysterectomy and no history of prior abnormal pap smears (unless she has a new sexual partner).  She will return to see me on a prn basis. She will return to see Dr [name] on an annual basis for well-woman care. She will return to see Dr [name] in May to discuss long term anticoagulation therapy vs stopping medical therapy for this.  HPI: [name] is a 35 year old G0 who is seen in consultation at the request of Dr [name] for an enlarged uterus with fibroids and acute RLE DVT.  Fibroids. She has undergone a abdominal myomectomy in 2009 which have controlled her symptoms of menorrhagia. She then took oral contraceptive pills for many years. However in a proximally June 2016 she began experiencing intermittent vaginal bleeding again. And was reevaluated for possibly a hysterectomy. However in November 2016 she developed symptoms of right lower extremity edema. She was admitted to was a local hospital for evaluation and an extensive right-sided lower extremity and pelvic DVT including the right common iliac vein was noted. This was felt  to be secondary to compression of the fibroid uterus on the infrarenal IVC. Due to occlusion of the IVC to the level of the infrarenal level there was a failed attempt at IVC filter placement and therefore thrombectomy and IVC filter placement was attempted but aborted. Instead she was treated with anticoagulant therapy with Lovenox.  Her gynecologist is Dr. Pamala Greer. Dr. Valentino Greer evaluated the patient as an inpatient and a plan was made to treat with anticoagulant therapy with an interval hysterectomy. The patient was treated with Aygestin to control her bleeding symptoms. This immediately resulted in resolution of vaginal bleeding to all but intermittent vaginal spotting. Since starting Lovenox on 01/07/2015 she has noted substantial improvement in her right lower extremity edema which she feels is about 50% of what it was at the time of presentation.  On 05/24/15 she underwent ex lap, TAH, bilateral salpingectomy for removal of a 2000+gram uterus. Surgery was uncomplicated as was postop recovery.   Current Meds:  Outpatient Encounter Prescriptions as of 06/08/2015  Medication Sig  . enoxaparin (LOVENOX) 80 MG/0.8ML injection Inject 0.65 mLs (65 mg total) into the skin every 12 (twelve) hours.  . Multiple Vitamin (MULTIVITAMIN) tablet Take 1 tablet by mouth daily.   No facility-administered encounter medications on file as of 06/08/2015.    Allergy: No Known Allergies  Social Hx:   Social History   Social History  . Marital Status: Married    Spouse Name: N/A  . Number of Children: N/A  . Years of Education: N/A   Occupational History  .  Not on file.   Social History Main Topics  . Smoking status: Never Smoker   . Smokeless tobacco: Never Used  . Alcohol Use: No  . Drug Use: No  . Sexual Activity: Yes   Other Topics Concern  . Not on file   Social History Narrative    Past Surgical Hx:  Past Surgical History  Procedure Laterality Date  . Uterine fibroid surgery  2009     [name]  . Laparotomy N/A 05/24/2015    Procedure: EXPLORATORY LAPAROTOMY;  Surgeon: Rachel Amber, MD;  Location: [facility];  Service: Gynecology;  Laterality: N/A;    Past Medical Hx:  Past Medical History  Diagnosis Date  . Fibroid, uterine   . DVT (deep venous thrombosis) ([place])   . Blood dyscrasia   . Anemia     Past Gynecological History:  Abdominal myomectomy 2009  Patient's last menstrual period was 01/17/2015.  Family Hx:  Family History  Problem Relation Age of Onset  . Hypertension Father   . Hyperlipidemia Father   . Diabetes Father   . Cancer Maternal Grandmother     Endometrial CA  . Hyperlipidemia Paternal Grandmother     Review of Systems:  Constitutional  Feels well,    ENT Normal appearing ears and nares bilaterally Skin/Breast  No rash, sores, jaundice, itching, dryness Cardiovascular  No chest pain, shortness of breath, or edema  Pulmonary  No cough or wheeze.  Gastro Intestinal  No nausea, vomitting, or diarrhoea. No bright red blood per rectum, no abdominal pain, change in bowel movement, or constipation.  Genito Urinary  No frequency, urgency, dysuria, no bleeding vaginally Musculo Skeletal  No myalgia, arthralgia, joint swelling or pain  Neurologic  No weakness, numbness, change in gait,  Psychology  No depression, anxiety, insomnia.   Vitals:  Blood pressure 125/85, pulse 112, temperature 98.3 F (36.8 C), temperature source Oral, resp. rate 18, height 5\' 5"  (1.651 m), weight 133 lb 11.2 oz (60.646 kg), last menstrual period 01/17/2015, SpO2 100 %.  Physical Exam: WD in NAD Neck  Supple NROM, without any enlargements.  Abdomen  Normoactive bowel sounds, abdomen soft, non-tender and thin without evidence of hernia. Incision healing normally. Back No CVA tenderness Genito Urinary  Vulva/vagina: Normal external female genitalia.  No lesions. No discharge or bleeding.  Bladder/urethra:  No lesions or masses, well supported bladder  Vagina:  normal. Vaginal cuff in tact with no lesions or blood.  Cervix: surgically absent  Uterus: surgically absent   Adnexa: no discrete masses. Rectal  deferred Extremities  No bilateral cyanosis, clubbing or edema.   Donaciano Eva, MD  06/08/2015, 4:30 PM   CC: Dr [name]

## 2015-06-08 NOTE — Patient Instructions (Signed)
Please call our office for any questions or concerns

## 2015-06-27 ENCOUNTER — Telehealth: Payer: Self-pay | Admitting: Hematology

## 2015-06-27 ENCOUNTER — Encounter: Payer: Self-pay | Admitting: Hematology

## 2015-06-27 ENCOUNTER — Ambulatory Visit (HOSPITAL_BASED_OUTPATIENT_CLINIC_OR_DEPARTMENT_OTHER): Payer: BLUE CROSS/BLUE SHIELD | Admitting: Hematology

## 2015-06-27 VITALS — BP 118/78 | HR 91 | Temp 98.5°F | Resp 18 | Wt 133.7 lb

## 2015-06-27 DIAGNOSIS — I825Z3 Chronic embolism and thrombosis of unspecified deep veins of distal lower extremity, bilateral: Secondary | ICD-10-CM

## 2015-06-27 DIAGNOSIS — D6862 Lupus anticoagulant syndrome: Secondary | ICD-10-CM

## 2015-06-27 DIAGNOSIS — I82890 Acute embolism and thrombosis of other specified veins: Secondary | ICD-10-CM | POA: Diagnosis not present

## 2015-06-27 DIAGNOSIS — I82401 Acute embolism and thrombosis of unspecified deep veins of right lower extremity: Secondary | ICD-10-CM | POA: Diagnosis not present

## 2015-06-27 DIAGNOSIS — R760 Raised antibody titer: Secondary | ICD-10-CM

## 2015-06-27 DIAGNOSIS — I82409 Acute embolism and thrombosis of unspecified deep veins of unspecified lower extremity: Secondary | ICD-10-CM | POA: Insufficient documentation

## 2015-06-27 MED ORDER — RIVAROXABAN 20 MG PO TABS: 20.0000 mg | ORAL_TABLET | Freq: Every day | ORAL | Status: DC

## 2015-06-27 NOTE — Telephone Encounter (Signed)
Gave and printed appt sched and avs for pt for June °

## 2015-07-03 NOTE — Progress Notes (Signed)
[name]    HEMATOLOGY/ONCOLOGY CLINIC NOTE  Date of Service: 06/27/2015  Patient Care Team: No Pcp Per Patient as PCP - General (General Practice) Dr. 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   2)  lupus anticoagulant positive ?[name] Acquired thrombophilia .  HISTORY OF PRESENTING ILLNESS:   [name] is a wonderful 35 y.o. female who has been referred to [place] by Dr .Denman George for evaluation and management of extensive infrarenal IVC DVT.  Patient has a history of uterine fibroid status post myomectomy in 2009 and was on oral contraceptives presented on 01/05/2015 with swelling in both her legs. She initially had an ultrasound of her lower extremities which were negative and therefore she was placed on HCTZ. She subsequently developed severe swelling in her right leg and went another venous duplex which revealed extensive right leg DVT leading to admission for thrombectomy and anticoagulation.  CTA scan of the chest done on 01/05/2015 did not show any evidence of pulmonary embolism.  She was noted to have a large fibroid uterus about 30 weeks' size which was causing compression of her infrarenal IVC. Thrombectomy and IVC filter placement was attempted but the stenosis of the infrarenal IVC did not allow passage of catheter. Thrombus was noted to extend up to the renal veins in the IVC. Patient was seen by Dr. Valentino Saxon from GYN, Dr. Beryle Beams and Dr. Julien Nordmann anticoagulation with interval hysterectomy was recommended . Patient was initially started on IV heparin and transitioned to lovenox on discharge.  Patient was also started on Lupron and daily norethindrone to help shrink her fibroids and help with fibroid related to bleeding while on anticoagulation.  Patient follow up with Dr. Denman George in clinic  on 01/25/2015 and was recommended at least 3 months of anticoagulation prior to hysterectomy. She was seen by Dr. Beryle Beams in clinic on 02/01/2015 and at the time was on Lovenox 1 mg/kg subcutaneous every 12 hours. She chose to follow up with [place] the [facility] where she was seen today on 02/22/2015 for the first time.  Patient notes no significant uterine or other bleeding. She notes that her bilateral lower extremity swelling is significantly improved. Still has lower abdominal and pelvic fullness.  No chest pain. No shortness of breath. Patient notes that she is dying of her Lovenox shots and would like to transition to an alternative anticoagulant if possible.  She notes that she has a MRI of the pelvis ordered for 03/09/2015 and she'll be seeing Dr. Denman George on 04/23/2015 for planning regarding hysterectomy.  No other issues with bleeding at this time.  Patient notes no family history of hereditary thrombophilia. She herself has been on oral contraceptive pills for 19-20 years without any previous thrombosis strongly suggesting against the presence of an inherited thrombophilia. Factor V Leiden mutation was negative. Prothrombin gene mutation was negative  Beta-2 glycoprotein antibodies negative. Anticardiolipin antibodies negative. Patient was noted to have presence of a lupus anticoagulant   INTERVAL HISTORY  Ms [name] is here scheduled follow-up.  She had an uneventful hysterectomy with removal of a large fibroid uterus.  She notes that her leg swelling is improved.  Was briefly on bridging Lovenox and then back  On Rivaroxaban.  Repeat lupus anticoagulant on 05/16/2015 was negative that suggests against  Anti - Phospholipid antibody syndrome due to lupus anticoagulant positivity.  she has completed 6 months of anticoagulation.  We discussed pros and cons of longer, anticoagulation.  we discussed that we shall  Continue anticoagulation for an additional month so that she is  about 2 months out from her surgery and repeat lower extremity ultrasound and a baseline d-dimer prior to discontinuing her therapeutic anticoagulation and switching her aspirin.  She is agreeable with this plan.  Notes small spot of progressive granulation tissue in the surgical scar. No evidence of infection.   She is in good spirits overall.  no issues with bleeding.  MEDICAL HISTORY:  Past Medical History  Diagnosis Date  . Fibroid, uterine   . DVT (deep venous thrombosis) ([place])   . Blood dyscrasia   . Anemia     SURGICAL HISTORY: Past Surgical History  Procedure Laterality Date  . Uterine fibroid surgery  2009    Dr. Rosana Hoes  . Laparotomy N/A 05/24/2015    Procedure: EXPLORATORY LAPAROTOMY;  Surgeon: Everitt Amber, MD;  Location: [facility];  Service: Gynecology;  Laterality: N/A;    SOCIAL HISTORY: Social History   Social History  . Marital Status: Married    Spouse Name: N/A  . Number of Children: N/A  . Years of Education: N/A   Occupational History  . Not on file.   Social History Main Topics  . Smoking status: Never Smoker   . Smokeless tobacco: Never Used  . Alcohol Use: No  . Drug Use: No  . Sexual Activity: Yes   Other Topics Concern  . Not on file   Social History Narrative    FAMILY HISTORY: Family History  Problem Relation Age of Onset  . Hypertension Father   . Hyperlipidemia Father   . Diabetes Father   . Cancer Maternal Grandmother     Endometrial CA  . Hyperlipidemia Paternal Grandmother   Paternal first cousin with lupus Paternal grandfather congestive heart failure  ALLERGIES:  has No Known Allergies.  MEDICATIONS:  Current Outpatient Prescriptions  Medication Sig Dispense Refill  . Multiple Vitamin (MULTIVITAMIN) tablet Take 1 tablet by mouth daily.    . rivaroxaban (XARELTO) 20 MG TABS tablet Take 1 tablet (20 mg total) by mouth daily with supper. 30 tablet 1   No current facility-administered medications for this visit.    REVIEW OF  SYSTEMS:    10 Point review of Systems was done is negative except as noted above.  PHYSICAL EXAMINATION: ECOG PERFORMANCE STATUS: 1 - Symptomatic but completely ambulatory  . Filed Vitals:   06/27/15 0907  BP: 118/78  Pulse: 91  Temp: 98.5 F (36.9 C)  Resp: 18   Filed Weights   06/27/15 0907  Weight: 133 lb 11.2 oz (60.646 kg)   .Body mass index is 22.25 kg/(m^2).  GENERAL:alert, in no acute distress and comfortable SKIN: skin color, texture, turgor are normal, no rashes or significant lesions EYES: normal, conjunctiva are pink and non-injected, sclera clear OROPHARYNX:no exudate, no erythema and lips, buccal mucosa, and tongue normal  NECK: supple, no JVD, thyroid normal size, non-tender, without nodularity LYMPH:  no palpable lymphadenopathy in the cervical, axillary or inguinal LUNGS: clear to auscultation with normal respiratory effort HEART: regular rate & rhythm,  no murmurs and 1+ bilateral pitting pedal edema right> left ABDOMEN: abdomen soft, non-tender, normoactive bowel sounds ,No hepatosplenomegaly , lower abdominal fullness  Musculoskeletal: no cyanosis of digits and no clubbing  PSYCH: alert & oriented x 3 with fluent speech NEURO: no focal motor/sensory deficits.  LABORATORY DATA:  I have  reviewed the data as listed  . CBC Latest Ref Rng 05/26/2015 05/25/2015 05/16/2015  WBC 4.0 - 10.5 K/uL 6.5 7.0 7.5  Hemoglobin 12.0 - 15.0 g/dL 10.3(L) 12.3 14.7  Hematocrit 36.0 - 46.0 % 29.7(L) 35.0(L) 42.9  Platelets 150 - 400 K/uL 192 207 247    . CMP Latest Ref Rng 05/25/2015 05/16/2015 01/05/2015  Glucose 65 - 99 mg/dL 128(H) 104 110(H)  BUN 6 - 20 mg/dL 8 12.8 6  Creatinine 0.44 - 1.00 mg/dL 0.66 0.9 0.67  Sodium 135 - 145 mmol/L 141 141 136  Potassium 3.5 - 5.1 mmol/L 3.9 3.8 3.6  Chloride 101 - 111 mmol/L 107 - 103  CO2 22 - 32 mmol/L 26 27 21(L)  Calcium 8.9 - 10.3 mg/dL 8.7(L) 9.5 9.3  Total Protein 6.4 - 8.3 g/dL - 8.0 -  Total Bilirubin 0.20 - 1.20  mg/dL - 0.35 -  Alkaline Phos 40 - 150 U/L - 31(L) -  AST 5 - 34 U/L - 12 -  ALT 0 - 55 U/L - 14 -   Component     Latest Ref Rng 05/16/2015  PTT-LA     0.0 - 43.6 sec 41.2  DRVVT     0.0 - 44.0 sec 40.6  Lupus Anticoag Interp      Comment:  Anticardiolipin Ab,IgG,Qn     0 - 14 GPL U/mL <9  Anticardiolipin Ab,IgM,Qn     0 - 12 MPL U/mL <9  Anticardiolipin Ab,IgA,Qn     0 - 11 APL U/mL <9  Beta-2 Glycoprotein I Ab, IgG     0 - 20 GPI IgG units <9  Beta-2 Glyco 1 IgA     0 - 25 GPI IgA units <9  Beta-2 Glyco 1 IgM     0 - 32 GPI IgM units <9   Lupus anticoagulant panel      No reference range information available      Comments: No lupus anticoagulant was detected   RADIOGRAPHIC STUDIES: I have personally reviewed the radiological images as listed and agreed with the findings in the report. No results found.  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 This is thought to be predominantly due to mechanical compression from a large fibroid uterus measuring about 30 weeks' size. Mechanical thrombectomy/ catheter directed thrombolysis was attempted in the hospital and unsuccessful. Patient has been on Lovenox since around mid November 2016 and notes that her lower extremity swelling is much improved . She had a limited thrombophilia testing which was negative for factor V Leiden mutation and prothrombin gene mutation . Anticardiolipin antibodies and beta 2 glycoprotein antibodies were negative . Noted to have presence of a lupus anticoagulant antibody .  patient has had her planned hysterectomy with Dr. Denman George and notes improvement in her lower extremity swelling.  #2 lupus anticoagulant antibody positivity in 12/2014. Repeat lupus anticoagulant drawn  05/16/2015 was negative suggesting that this is likely not a true ongoing risk for thrombosis.  Plan - patient is doing well on Xarelto  and has completed 6 months of anticoagulation. - she is status post hysterectomy with improvement in lower abdominal and pelvic pressure as well as lower extremity swelling. - given her extensive lower extremity clot burden in the past we discussed the pros and cons of discontinuing anticoagulation in 6 months  Versus pursuing longer anticoagulation for 9-12 months. - she notes that she would like to  get off anticoagulation sooner if it is reasonably safe. - we will continue Xarelto for 1 additional month and get baseline CBC and d-dimer and a repeat ultrasound of her lower extremities to reassess  Chronic  Clot burden in her lower extremity veins.  We would use these 2 parameters to determine duration of anticoagulation and timing of transition to aspirin. -Continue using compression socks due to high risk of post-thrombotic syndrome.  -Return to care with Dr. Irene Limbo in one month  With repeat CBC, d-dimer and lower extremity venous ultrasound   Sullivan Lone MD MS AAHIVMS [facility] Hematology/Oncology Physician [facility]  (Office):       [phone] (Work cell):  [phone] (Fax):           [phone]

## 2015-07-27 ENCOUNTER — Ambulatory Visit (HOSPITAL_BASED_OUTPATIENT_CLINIC_OR_DEPARTMENT_OTHER): Payer: BLUE CROSS/BLUE SHIELD | Admitting: Hematology

## 2015-07-27 ENCOUNTER — Ambulatory Visit (HOSPITAL_COMMUNITY)
Admission: RE | Admit: 2015-07-27 | Discharge: 2015-07-27 | Disposition: A | Discharge status: Home / Self Care | Payer: BLUE CROSS/BLUE SHIELD | Source: Ambulatory Visit | Attending: Hematology | Admitting: Hematology

## 2015-07-27 ENCOUNTER — Encounter: Payer: Self-pay | Admitting: Hematology

## 2015-07-27 ENCOUNTER — Other Ambulatory Visit (HOSPITAL_BASED_OUTPATIENT_CLINIC_OR_DEPARTMENT_OTHER): Payer: BLUE CROSS/BLUE SHIELD

## 2015-07-27 ENCOUNTER — Telehealth: Payer: Self-pay | Admitting: Hematology

## 2015-07-27 VITALS — BP 126/72 | HR 75 | Temp 98.0°F | Resp 16 | Wt 132.4 lb

## 2015-07-27 DIAGNOSIS — Z7901 Long term (current) use of anticoagulants: Secondary | ICD-10-CM | POA: Diagnosis not present

## 2015-07-27 DIAGNOSIS — I82401 Acute embolism and thrombosis of unspecified deep veins of right lower extremity: Secondary | ICD-10-CM

## 2015-07-27 DIAGNOSIS — I82890 Acute embolism and thrombosis of other specified veins: Secondary | ICD-10-CM | POA: Diagnosis not present

## 2015-07-27 DIAGNOSIS — I825Z3 Chronic embolism and thrombosis of unspecified deep veins of distal lower extremity, bilateral: Secondary | ICD-10-CM

## 2015-07-27 DIAGNOSIS — D6862 Lupus anticoagulant syndrome: Secondary | ICD-10-CM

## 2015-07-27 LAB — CBC & DIFF AND RETIC
BASO%: 0.3 % (ref 0.0–2.0)
BASOS ABS: 0 10*3/uL (ref 0.0–0.1)
EOS%: 2.1 % (ref 0.0–7.0)
Eosinophils Absolute: 0.1 10*3/uL (ref 0.0–0.5)
HEMATOCRIT: 42.6 % (ref 34.8–46.6)
HGB: 14.4 g/dL (ref 11.6–15.9)
IMMATURE RETIC FRACT: 2.9 % (ref 1.60–10.00)
LYMPH%: 31.2 % (ref 14.0–49.7)
MCH: 31.6 pg (ref 25.1–34.0)
MCHC: 33.8 g/dL (ref 31.5–36.0)
MCV: 93.4 fL (ref 79.5–101.0)
MONO#: 0.5 10*3/uL (ref 0.1–0.9)
MONO%: 6.9 % (ref 0.0–14.0)
NEUT#: 4 10*3/uL (ref 1.5–6.5)
NEUT%: 59.5 % (ref 38.4–76.8)
Platelets: 233 10*3/uL (ref 145–400)
RBC: 4.56 10*6/uL (ref 3.70–5.45)
RDW: 12 % (ref 11.2–14.5)
Retic %: 0.88 % (ref 0.70–2.10)
Retic Ct Abs: 40.13 10*3/uL (ref 33.70–90.70)
WBC: 6.7 10*3/uL (ref 3.9–10.3)
lymph#: 2.1 10*3/uL (ref 0.9–3.3)

## 2015-07-27 LAB — COMPREHENSIVE METABOLIC PANEL
ALBUMIN: 4.4 g/dL (ref 3.5–5.0)
ALK PHOS: 50 U/L (ref 40–150)
ALT: 11 U/L (ref 0–55)
AST: 9 U/L (ref 5–34)
Anion Gap: 8 mEq/L (ref 3–11)
BILIRUBIN TOTAL: 0.55 mg/dL (ref 0.20–1.20)
BUN: 10.3 mg/dL (ref 7.0–26.0)
CO2: 28 mEq/L (ref 22–29)
CREATININE: 0.8 mg/dL (ref 0.6–1.1)
Calcium: 9.8 mg/dL (ref 8.4–10.4)
Chloride: 104 mEq/L (ref 98–109)
EGFR: >90 mL/min/{1.73_m2} (ref >90)
GLUCOSE: 78 mg/dL (ref 70–140)
POTASSIUM: 4.4 meq/L (ref 3.5–5.1)
Sodium: 140 mEq/L (ref 136–145)
TOTAL PROTEIN: 7.6 g/dL (ref 6.4–8.3)

## 2015-07-27 MED ORDER — RIVAROXABAN 20 MG PO TABS: 20.0000 mg | ORAL_TABLET | Freq: Every day | ORAL | Status: AC

## 2015-07-27 NOTE — Progress Notes (Addendum)
VASCULAR LAB PRELIMINARY  PRELIMINARY  PRELIMINARY  PRELIMINARY  Bilateral lower extremity venous duplex completed.    Preliminary report:  Right:  DVT noted in the mid and distal section of the femoral vein.   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   Left leg No evidence of DVT.  Bilateral: No evidence of superficial thrombosis.  No Baker's cyst.  Call Dr. Pricilla Handler with [facility].  Sevin Langenbach, RVT, RDMS 07/27/2015, 9:55 AM

## 2015-07-27 NOTE — Telephone Encounter (Signed)
Gave and printed appt sched and avs fo rpt for OCT °

## 2015-07-28 LAB — D-DIMER, QUANTITATIVE (NOT AT ARMC)

## 2015-07-28 NOTE — Progress Notes (Signed)
[name]    HEMATOLOGY/ONCOLOGY CLINIC NOTE  Date of Service:  .07/27/2015   Patient Care Team: No Pcp Per Patient as PCP - General (General Practice) Dr. 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   2)  lupus anticoagulant positive ?[name] Acquired thrombophilia .rpt lupus anticoagulant neg ---suggesting transient positivity.  HISTORY OF PRESENTING ILLNESS:   [name] is a wonderful 35 y.o. female who has been referred to [place] by Dr .Denman George for evaluation and management of extensive infrarenal IVC DVT.  Patient has a history of uterine fibroid status post myomectomy in 2009 and was on oral contraceptives presented on 01/05/2015 with swelling in both her legs. She initially had an ultrasound of her lower extremities which were negative and therefore she was placed on HCTZ. She subsequently developed severe swelling in her right leg and went another venous duplex which revealed extensive right leg DVT leading to admission for thrombectomy and anticoagulation.  CTA scan of the chest done on 01/05/2015 did not show any evidence of pulmonary embolism.  She was noted to have a large fibroid uterus about 30 weeks' size which was causing compression of her infrarenal IVC. Thrombectomy and IVC filter placement was attempted but the stenosis of the infrarenal IVC did not allow passage of catheter. Thrombus was noted to extend up to the renal veins in the IVC. Patient was seen by Dr. Valentino Saxon from GYN, Dr. Beryle Beams and Dr. Julien Nordmann anticoagulation with interval hysterectomy was recommended . Patient was initially started on IV heparin and transitioned to lovenox on discharge.  Patient was also started on Lupron and daily norethindrone to help shrink her fibroids and help with fibroid related to bleeding  while on anticoagulation.  Patient follow up with Dr. Denman George in clinic on 01/25/2015 and was recommended at least 3 months of anticoagulation prior to hysterectomy. She was seen by Dr. Beryle Beams in clinic on 02/01/2015 and at the time was on Lovenox 1 mg/kg subcutaneous every 12 hours. She chose to follow up with [place] the [facility] where she was seen today on 02/22/2015 for the first time.  Patient notes no significant uterine or other bleeding. She notes that her bilateral lower extremity swelling is significantly improved. Still has lower abdominal and pelvic fullness.  No chest pain. No shortness of breath. Patient notes that she is dying of her Lovenox shots and would like to transition to an alternative anticoagulant if possible.  She notes that she has a MRI of the pelvis ordered for 03/09/2015 and she'll be seeing Dr. Denman George on 04/23/2015 for planning regarding hysterectomy.  No other issues with bleeding at this time.  Patient notes no family history of hereditary thrombophilia. She herself has been on oral contraceptive pills for 19-20 years without any previous thrombosis strongly suggesting against the presence of an inherited thrombophilia. Factor V Leiden mutation was negative. Prothrombin gene mutation was negative  Beta-2 glycoprotein antibodies negative. Anticardiolipin antibodies negative. Patient was noted to have presence of a lupus anticoagulant   INTERVAL HISTORY  [name] is here scheduled follow-up.  She notes no acute new symptoms. No lower extremity pain and swelling.  She is in good spirits overall. She has now completed about 7 months of anticoagulation. Rpt d-dimer today WNL. [place] b/l lower extremities prelim show some RLE DVT likely chronic --but final report pending.  Patient continues to be on Xarelto and has been very compliant with this. No symptoms suggestive of post-thrombotic syndrome. No issues with bleeding.  MEDICAL HISTORY:  Past Medical History   Diagnosis Date  . Fibroid, uterine   . DVT (deep venous thrombosis) ([place])   . Blood dyscrasia   . Anemia     SURGICAL HISTORY: Past Surgical History  Procedure Laterality Date  . Uterine fibroid surgery  2009    Dr. Rosana Hoes  . Laparotomy N/A 05/24/2015    Procedure: EXPLORATORY LAPAROTOMY;  Surgeon: Everitt Amber, MD;  Location: [facility];  Service: Gynecology;  Laterality: N/A;    SOCIAL HISTORY: Social History   Social History  . Marital Status: Married    Spouse Name: N/A  . Number of Children: N/A  . Years of Education: N/A   Occupational History  . Not on file.   Social History Main Topics  . Smoking status: Never Smoker   . Smokeless tobacco: Never Used  . Alcohol Use: No  . Drug Use: No  . Sexual Activity: Yes   Other Topics Concern  . Not on file   Social History Narrative    FAMILY HISTORY: Family History  Problem Relation Age of Onset  . Hypertension Father   . Hyperlipidemia Father   . Diabetes Father   . Cancer Maternal Grandmother     Endometrial CA  . Hyperlipidemia Paternal Grandmother   Paternal first cousin with lupus Paternal grandfather congestive heart failure  ALLERGIES:  has No Known Allergies.  MEDICATIONS:  Current Outpatient Prescriptions  Medication Sig Dispense Refill  . Multiple Vitamin (MULTIVITAMIN) tablet Take 1 tablet by mouth daily.    . rivaroxaban (XARELTO) 20 MG TABS tablet Take 1 tablet (20 mg total) by mouth daily with supper. 30 tablet 4   No current facility-administered medications for this visit.    REVIEW OF SYSTEMS:    10 Point review of Systems was done is negative except as noted above.  PHYSICAL EXAMINATION: ECOG PERFORMANCE STATUS: 1 - Symptomatic but completely ambulatory  . Filed Vitals:   07/27/15 1010  BP: 126/72  Pulse: 75  Temp: 98 F (36.7 C)  Resp: 16   Filed Weights   07/27/15 1010  Weight: 132 lb 6.4 oz (60.056 kg)   .Body mass index is 22.03 kg/(m^2).  GENERAL:alert, in no acute  distress and comfortable SKIN: skin color, texture, turgor are normal, no rashes or significant lesions EYES: normal, conjunctiva are pink and non-injected, sclera clear OROPHARYNX:no exudate, no erythema and lips, buccal mucosa, and tongue normal  NECK: supple, no JVD, thyroid normal size, non-tender, without nodularity LYMPH:  no palpable lymphadenopathy in the cervical, axillary or inguinal LUNGS: clear to auscultation with normal respiratory effort HEART: regular rate & rhythm,  no murmurs and 1+ bilateral pitting pedal edema right> left ABDOMEN: abdomen soft, non-tender, normoactive bowel sounds ,No hepatosplenomegaly , lower abdominal fullness  Musculoskeletal: no cyanosis of digits and no clubbing  PSYCH: alert & oriented x 3 with fluent speech NEURO: no focal motor/sensory deficits.  LABORATORY DATA:  I have reviewed the data as listed  . CBC Latest Ref Rng 07/27/2015 05/26/2015 05/25/2015  WBC 3.9 - 10.3 10e3/uL 6.7 6.5 7.0  Hemoglobin 11.6 - 15.9 g/dL 14.4 10.3(L) 12.3  Hematocrit 34.8 - 46.6 % 42.6 29.7(L) 35.0(L)  Platelets 145 - 400 10e3/uL 233 192 207    . CMP Latest Ref Rng 07/27/2015 05/25/2015 05/16/2015  Glucose 70 - 140 mg/dl 78 128(H) 104  BUN  7.0 - 26.0 mg/dL 10.3 8 12.8  Creatinine 0.6 - 1.1 mg/dL 0.8 0.66 0.9  Sodium 136 - 145 mEq/L 140 141 141  Potassium 3.5 - 5.1 mEq/L 4.4 3.9 3.8  Chloride 101 - 111 mmol/L - 107 -  CO2 22 - 29 mEq/L 28 26 27   Calcium 8.4 - 10.4 mg/dL 9.8 8.7(L) 9.5  Total Protein 6.4 - 8.3 g/dL 7.6 - 8.0  Total Bilirubin 0.20 - 1.20 mg/dL 0.55 - 0.35  Alkaline Phos 40 - 150 U/L 50 - 31(L)  AST 5 - 34 U/L 9 - 12  ALT 0 - 55 U/L 11 - 14   Component     Latest Ref Rng 05/16/2015  PTT-LA     0.0 - 43.6 sec 41.2  DRVVT     0.0 - 44.0 sec 40.6  Lupus Anticoag Interp      Comment:  Anticardiolipin Ab,IgG,Qn     0 - 14 GPL U/mL <9  Anticardiolipin Ab,IgM,Qn     0 - 12 MPL U/mL <9  Anticardiolipin Ab,IgA,Qn     0 - 11 APL U/mL <9  Beta-2  Glycoprotein I Ab, IgG     0 - 20 GPI IgG units <9  Beta-2 Glyco 1 IgA     0 - 25 GPI IgA units <9  Beta-2 Glyco 1 IgM     0 - 32 GPI IgM units <9   Lupus anticoagulant panel      No reference range information available      Comments: No lupus anticoagulant was detected   RADIOGRAPHIC STUDIES: I have personally reviewed the radiological images as listed and agreed with the findings in the report. No results found.  ASSESSMENT & PLAN:   36 year old Caucasian female with  #1 Extensive venous thrombosis of the infrarenal IVC and right lower extremity. This is thought to be predominantly due to mechanical compression from a large fibroid uterus measuring about 30 weeks' size. Mechanical thrombectomy/ catheter directed thrombolysis was attempted in the hospital and unsuccessful. Patient has been on Lovenoxand then Xarelto since around mid November 2016 and notes that her lower extremity swelling is much improved . She had a limited thrombophilia testing which was negative for factor V Leiden mutation and prothrombin gene mutation . Anticardiolipin antibodies and beta 2 glycoprotein antibodies were negative . Noted to have presence of a transient lupus anticoagulant antibody .  patient has had her planned hysterectomy with Dr. Denman George and notes improvement in her lower extremity swelling.  #2 lupus anticoagulant antibody positivity in 12/2014. Repeat lupus anticoagulant drawn  05/16/2015 was negative suggesting that this is likely not a true ongoing risk for thrombosis and was a transient positive antibody.  Plan - patient is doing well on Xarelto and has completed about 7 months of anticoagulation. -d-dimer neg today though [place] with residual DVT likely chronic ---but final report is pending at this time. -patient notes she would like to continue anticoagulation for atleast 77months. -if final [place] result shows only chronic DVT -- no change in anticoagulation plan -if patient is noted to have  asymptomatic acute new DVT despite compliant anticoagulation would recommend vascular surgery evaluation to consider venogram to r/o persistent venous flow obstruction and angioplasty if needed. Also whether changing to an alternative anticoagulation/adding ASA would be useful is debatable but might need to be considered. -Continue using compression socks due to high risk of post-thrombotic syndrome. -Xarelto refilled.  -we will call patient with final [place] ext results -Return to care with Dr.  Kale in 4 months.    Sullivan Lone MD [place] AAHIVMS [facility] Hematology/Oncology Physician [facility]  (Office):       [phone] (Work cell):  [phone] (Fax):           [phone]

## 2015-07-30 ENCOUNTER — Other Ambulatory Visit: Payer: Self-pay | Admitting: Hematology

## 2015-07-30 DIAGNOSIS — I824Z3 Acute embolism and thrombosis of unspecified deep veins of distal lower extremity, bilateral: Secondary | ICD-10-CM

## 2015-07-30 NOTE — Progress Notes (Signed)
  US Venous B/L 07/27/2015 Summary:  - Findings consistent with acute versus subacute deep vein  thrombosis involving the right femoral vein. - Findings consistent with chronic thrombosis involving the right  common femoral vein, right profunda femoris vein, and right  proximal femoral vein.  Other specific details can be found in the table(s) above. Prepared and Electronically Authenticated by  Annamarie Major IV, MD 2017-06-12T07:48:23   D-dimer levels within normal limits. Patient had no new symptoms.   I called and discussed the results of the ultrasound and the D dimers with Ms. September Brizzolara.  Ultrasound shows large burden of chronic DVT with some likely subacute DVT. Seems less likely to be acute based on completely normal d-dimer levels.  Additional subacute component could be related to her recent surgery but there is some concern about possible sluggish blood flow in her lower extremity venous system with possible venous stricture/persistent flow obstruction despite her hysterectomy believing pressure on the IVC. Patient has been compliant with her Rivaroxaban and I doubt that this represents a failure of her anticoagulation. PLAN -Patient counseled to report worsening or axillary symptoms immediately. -We are planning to continue Rivaroxaban to complete at least one year of treatment -patient is agreeable with this. -Would recommend vascular surgery consultation (referral placed) to weigh in on the need for possible venogram and obstruction present to consider venoplasty +/- stenting. -Patient will follow up with [place] according to her appointment in 4 months  .Gonzalez

## 2015-07-31 ENCOUNTER — Telehealth: Payer: Self-pay | Admitting: Hematology

## 2015-07-31 ENCOUNTER — Encounter: Payer: Self-pay | Admitting: Vascular Surgery

## 2015-07-31 NOTE — Telephone Encounter (Signed)
Called [facility] they are working on the referral

## 2015-08-01 ENCOUNTER — Other Ambulatory Visit: Payer: Self-pay | Admitting: Hematology

## 2015-08-01 ENCOUNTER — Ambulatory Visit (INDEPENDENT_AMBULATORY_CARE_PROVIDER_SITE_OTHER): Payer: BLUE CROSS/BLUE SHIELD | Admitting: Vascular Surgery

## 2015-08-01 ENCOUNTER — Encounter (HOSPITAL_COMMUNITY): Payer: BLUE CROSS/BLUE SHIELD

## 2015-08-01 ENCOUNTER — Encounter: Payer: Self-pay | Admitting: Vascular Surgery

## 2015-08-01 VITALS — BP 123/84 | HR 116 | Ht 65.0 in | Wt 132.0 lb

## 2015-08-01 DIAGNOSIS — I872 Venous insufficiency (chronic) (peripheral): Secondary | ICD-10-CM

## 2015-08-01 NOTE — Progress Notes (Signed)
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 Irene Limbo.   HPI: [name] is a 35 y.o. female, who developed an extensive right lower extremity DVT as documented below in November 2016. This was felt most likely to be related to a large fibroid uterus. She has subsequently had a hysterectomy 9 weeks ago and has done very well since that time. Her swelling in the leg improved significantly and she denies any significant symptoms in the right leg. Specifically, she denies aching, heaviness, throbbing. She remains on anticoagulation.  I have reviewed the records that were sent from the oncology office. 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  The patient had attempted thrombolysis but this was not successful.. In addition they were unable to place an IVC filter because of obstruction.  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 The patient was a lupus anticoagulant antibody positive in November 2016, but repeat lupus anticoagulant study in March 2017 was negative suggesting that this was a transient positive antibody. I believe the plan was to continue anticoagulation for at least 12 months.  Past Medical History  Diagnosis Date  . Fibroid, uterine   . DVT (deep venous thrombosis) ([place])   . Blood dyscrasia   . Anemia     Family History  Problem Relation Age of Onset  . Hypertension Father   . Hyperlipidemia Father   . Diabetes Father   . Cancer Maternal Grandmother     Endometrial CA  . Hyperlipidemia Paternal Grandmother     SOCIAL HISTORY: Social History   Social History  . Marital Status: Married    Spouse Name: N/A  . Number of Children:  N/A  . Years of Education: N/A   Occupational History  . Not on file.   Social History Main Topics  . Smoking status: Never Smoker   . Smokeless tobacco: Never Used  . Alcohol Use: No  . Drug Use: No  . Sexual Activity: Yes   Other Topics Concern  . Not on file   Social History Narrative    No Known Allergies  Current Outpatient Prescriptions  Medication Sig Dispense Refill  . Multiple Vitamin (MULTIVITAMIN) tablet Take 1 tablet by mouth daily.    . rivaroxaban (XARELTO) 20 MG TABS tablet Take 1 tablet (20 mg total) by mouth daily with supper. 30 tablet 4   No current facility-administered medications for this visit.    REVIEW OF SYSTEMS:  [X]  denotes positive finding, [ ]  denotes negative finding Cardiac  Comments:  Chest pain or chest pressure:    Shortness of breath upon exertion:    Short of breath when lying flat:    Irregular heart rhythm:        Vascular    Pain in calf, thigh, or hip brought on by ambulation:    Pain in feet at night that wakes you up from your sleep:     Blood clot in your veins: X Right lower extremity  Leg swelling:         Pulmonary    Oxygen at home:    Productive cough:     Wheezing:         Neurologic    Sudden  weakness in arms or legs:     Sudden numbness in arms or legs:     Sudden onset of difficulty speaking or slurred speech:    Temporary loss of vision in one eye:     Problems with dizziness:         Gastrointestinal    Blood in stool:     Vomited blood:         Genitourinary    Burning when urinating:     Blood in urine:        Psychiatric    Major depression:         Hematologic    Bleeding problems:    Problems with blood clotting too easily:        Skin    Rashes or ulcers:        Constitutional    Fever or chills:      PHYSICAL EXAM: Filed Vitals:   08/01/15 1505  BP: 123/84  Pulse: 116  Height: 5\' 5"  (1.651 m)  Weight: 132 lb (59.875 kg)  SpO2: 99%    GENERAL: The patient is a  well-nourished female, in no acute distress. The vital signs are documented above. CARDIAC: There is a regular rate and rhythm.  VASCULAR: I do not detect carotid bruits. She has palpable pedal pulses bilaterally. She has mild bilateral lower extremity swelling. PULMONARY: There is good air exchange bilaterally without wheezing or rales. ABDOMEN: Soft and non-tender with normal pitched bowel sounds.  MUSCULOSKELETAL: There are no major deformities or cyanosis. NEUROLOGIC: No focal weakness or paresthesias are detected. SKIN: There are no ulcers or rashes noted. PSYCHIATRIC: The patient has a normal affect.  DATA:   VENOUS DUPLEX: I have reviewed the venous duplex scan which was performed on 01/05/2015.  This showed evidence of acute DVT involving the right common femoral vein, right profunda femoris vein, right femoral vein, right popliteal vein and right posterior tibial veins and peroneal veins. Also superficial venous thrombosis involving the right great saphenous vein.  VENOUS DUPLEX: I have reviewed the venous duplex scan that was performed on 07/27/15. There was evidence of chronic thrombus involving the right common femoral vein, right profunda femoris vein, and right proximal femoral vein. In addition, the findings on this study were consistent with an acute versus subacute DVT involving the right femoral vein. However, based on the previous duplex scan I do not think these findings in the right femoral vein are new.  MEDICAL ISSUES:  HISTORY OF EXTENSIVE RIGHT LOWER EXTREMITY DVT: Based on the duplex scan in June of this year, I do not see evidence of new DVT compared to the previous study in November. In addition, her symptoms have improved significantly since she had her hysterectomy. The plan is for a follow up duplex scan before she comes off her Xeralto in the fall. I think this sounds perfectly reasonable. I do not think any further invasive workup is needed at this time. We have  discussed the importance of intermittent leg elevation, exercise, avoiding prolonged sitting and standing, and compression stockings. She clearly is at risk for post phlebitic syndrome. I'll be happy to see her back at anytime if any new vascular issues arise.   Deitra Mayo [facility] [place] [phone]

## 2015-12-05 ENCOUNTER — Other Ambulatory Visit: Payer: Self-pay | Admitting: *Deleted

## 2015-12-06 ENCOUNTER — Encounter: Payer: Self-pay | Admitting: Hematology

## 2015-12-06 ENCOUNTER — Ambulatory Visit (HOSPITAL_BASED_OUTPATIENT_CLINIC_OR_DEPARTMENT_OTHER): Payer: BLUE CROSS/BLUE SHIELD | Admitting: Hematology

## 2015-12-06 ENCOUNTER — Other Ambulatory Visit: Payer: BLUE CROSS/BLUE SHIELD

## 2015-12-06 VITALS — BP 131/69 | HR 93 | Temp 98.1°F | Resp 18 | Ht 65.0 in | Wt 126.2 lb

## 2015-12-06 DIAGNOSIS — I82890 Acute embolism and thrombosis of other specified veins: Secondary | ICD-10-CM

## 2015-12-06 DIAGNOSIS — I82401 Acute embolism and thrombosis of unspecified deep veins of right lower extremity: Secondary | ICD-10-CM

## 2015-12-06 DIAGNOSIS — Z7901 Long term (current) use of anticoagulants: Secondary | ICD-10-CM | POA: Diagnosis not present

## 2015-12-06 DIAGNOSIS — I825Z3 Chronic embolism and thrombosis of unspecified deep veins of distal lower extremity, bilateral: Secondary | ICD-10-CM

## 2015-12-06 DIAGNOSIS — D6862 Lupus anticoagulant syndrome: Secondary | ICD-10-CM

## 2015-12-06 DIAGNOSIS — D6859 Other primary thrombophilia: Secondary | ICD-10-CM

## 2015-12-06 NOTE — Progress Notes (Signed)
[name]    HEMATOLOGY/ONCOLOGY CLINIC NOTE  Date of Service:  .12/06/2015   Patient Care Team: No Pcp Per Patient as PCP - General (General Practice) Brunetta Genera, MD as Consulting Physician (Hematology) Dr. 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   2)  lupus anticoagulant positive ?[name] Acquired thrombophilia .rpt lupus anticoagulant neg ---suggesting transient positivity.  HISTORY OF PRESENTING ILLNESS:   [name] is a wonderful 35 y.o. female who has been referred to [place] by Dr .Denman George for evaluation and management of extensive infrarenal IVC DVT.  Patient has a history of uterine fibroid status post myomectomy in 2009 and was on oral contraceptives presented on 01/05/2015 with swelling in both her legs. She initially had an ultrasound of her lower extremities which were negative and therefore she was placed on HCTZ. She subsequently developed severe swelling in her right leg and went another venous duplex which revealed extensive right leg DVT leading to admission for thrombectomy and anticoagulation.  CTA scan of the chest done on 01/05/2015 did not show any evidence of pulmonary embolism.  She was noted to have a large fibroid uterus about 30 weeks' size which was causing compression of her infrarenal IVC. Thrombectomy and IVC filter placement was attempted but the stenosis of the infrarenal IVC did not allow passage of catheter. Thrombus was noted to extend up to the renal veins in the IVC. Patient was seen by Dr. Valentino Saxon from GYN, Dr. Beryle Beams and Dr. Julien Nordmann anticoagulation with interval hysterectomy was recommended . Patient was initially started on IV heparin and transitioned to lovenox on discharge.  Patient was also started on Lupron and daily norethindrone to help  shrink her fibroids and help with fibroid related to bleeding while on anticoagulation.  Patient follow up with Dr. Denman George in clinic on 01/25/2015 and was recommended at least 3 months of anticoagulation prior to hysterectomy. She was seen by Dr. Beryle Beams in clinic on 02/01/2015 and at the time was on Lovenox 1 mg/kg subcutaneous every 12 hours. She chose to follow up with [place] [facility] where she was seen today on 02/22/2015 for the first time.  Patient notes no significant uterine or other bleeding. She notes that her bilateral lower extremity swelling is significantly improved. Still has lower abdominal and pelvic fullness.  No chest pain. No shortness of breath. Patient notes that she is dying of her Lovenox shots and would like to transition to an alternative anticoagulant if possible.  She notes that she has a MRI of the pelvis ordered for 03/09/2015 and she'll be seeing Dr. Denman George on 04/23/2015 for planning regarding hysterectomy.  No other issues with bleeding at this time.  Patient notes no family history of hereditary thrombophilia. She herself has been on oral contraceptive pills for 19-20 years without any previous thrombosis strongly suggesting against the presence of an inherited thrombophilia. Factor V Leiden mutation was negative. Prothrombin gene mutation was negative  Beta-2 glycoprotein antibodies negative. Anticardiolipin antibodies negative. Patient was noted to have presence of a lupus anticoagulant   INTERVAL HISTORY  [name] is here scheduled follow-up. She has nearly completed her planned 12 months of anticoagulation. Notes no leg swelling or leg pain. Has no clear symptomology suggestive of post thrombotic syndrome. No fevers or chills. He was seen by Dr. Scot Dock in vascular surgery who  did not feel the need for a venogram and noted that most of the clot was chronic.  MEDICAL HISTORY:  Past Medical History:  Diagnosis Date  . Anemia   . Blood  dyscrasia   . DVT (deep venous thrombosis) ([place])   . Fibroid, uterine     SURGICAL HISTORY: Past Surgical History:  Procedure Laterality Date  . ABDOMINAL HYSTERECTOMY    . LAPAROTOMY N/A 05/24/2015   Procedure: EXPLORATORY LAPAROTOMY;  Surgeon: Everitt Amber, MD;  Location: [facility];  Service: Gynecology;  Laterality: N/A;  . MYOMECTOMY    . UTERINE FIBROID SURGERY  2009   Dr. Rosana Hoes    SOCIAL HISTORY: Social History   Social History  . Marital status: Married    Spouse name: N/A  . Number of children: N/A  . Years of education: N/A   Occupational History  . Not on file.   Social History Main Topics  . Smoking status: Never Smoker  . Smokeless tobacco: Never Used  . Alcohol use No  . Drug use: No  . Sexual activity: Yes   Other Topics Concern  . Not on file   Social History Narrative  . No narrative on file    FAMILY HISTORY: Family History  Problem Relation Age of Onset  . Hypertension Father   . Hyperlipidemia Father   . Diabetes Father   . Cancer Maternal Grandmother     Endometrial CA  . Hyperlipidemia Paternal Grandmother   Paternal first cousin with lupus Paternal grandfather congestive heart failure  ALLERGIES:  has No Known Allergies.  MEDICATIONS:  Current Outpatient Prescriptions  Medication Sig Dispense Refill  . Multiple Vitamin (MULTIVITAMIN) tablet Take 1 tablet by mouth daily.    . rivaroxaban (XARELTO) 20 MG TABS tablet Take 1 tablet (20 mg total) by mouth daily with supper. 30 tablet 4   No current facility-administered medications for this visit.     REVIEW OF SYSTEMS:    10 Point review of Systems was done is negative except as noted above.  PHYSICAL EXAMINATION: ECOG PERFORMANCE STATUS: 1 - Symptomatic but completely ambulatory  . Vitals:   12/06/15 0846  BP: 131/69  Pulse: 93  Resp: 18  Temp: 98.1 F (36.7 C)   Filed Weights   12/06/15 0846  Weight: 126 lb 3.2 oz (57.2 kg)   .Body mass index is 21  kg/m.  GENERAL:alert, in no acute distress and comfortable SKIN: skin color, texture, turgor are normal, no rashes or significant lesions EYES: normal, conjunctiva are pink and non-injected, sclera clear OROPHARYNX:no exudate, no erythema and lips, buccal mucosa, and tongue normal  NECK: supple, no JVD, thyroid normal size, non-tender, without nodularity LYMPH:  no palpable lymphadenopathy in the cervical, axillary or inguinal LUNGS: clear to auscultation with normal respiratory effort HEART: regular rate & rhythm,  no murmurs and 1+ bilateral pitting pedal edema right> left ABDOMEN: abdomen soft, non-tender, normoactive bowel sounds ,No hepatosplenomegaly , lower abdominal fullness  Musculoskeletal: no cyanosis of digits and no clubbing  PSYCH: alert & oriented x 3 with fluent speech NEURO: no focal motor/sensory deficits.  LABORATORY DATA:  I have reviewed the data as listed  . CBC Latest Ref Rng & Units 07/27/2015 05/26/2015 05/25/2015  WBC 3.9 - 10.3 10e3/uL 6.7 6.5 7.0  Hemoglobin 11.6 - 15.9 g/dL 14.4 10.3(L) 12.3  Hematocrit 34.8 - 46.6 % 42.6 29.7(L) 35.0(L)  Platelets 145 - 400 10e3/uL 233 192 207    . CMP Latest Ref Rng & Units 07/27/2015 05/25/2015  05/16/2015  Glucose 70 - 140 mg/dl 78 128(H) 104  BUN 7.0 - 26.0 mg/dL 10.3 8 12.8  Creatinine 0.6 - 1.1 mg/dL 0.8 0.66 0.9  Sodium 136 - 145 mEq/L 140 141 141  Potassium 3.5 - 5.1 mEq/L 4.4 3.9 3.8  Chloride 101 - 111 mmol/L - 107 -  CO2 22 - 29 mEq/L 28 26 27   Calcium 8.4 - 10.4 mg/dL 9.8 8.7(L) 9.5  Total Protein 6.4 - 8.3 g/dL 7.6 - 8.0  Total Bilirubin 0.20 - 1.20 mg/dL 0.55 - 0.35  Alkaline Phos 40 - 150 U/L 50 - 31(L)  AST 5 - 34 U/L 9 - 12  ALT 0 - 55 U/L 11 - 14   Component     Latest Ref Rng 05/16/2015  PTT-LA     0.0 - 43.6 sec 41.2  DRVVT     0.0 - 44.0 sec 40.6  Lupus Anticoag Interp      Comment:  Anticardiolipin Ab,IgG,Qn     0 - 14 GPL U/mL <9  Anticardiolipin Ab,IgM,Qn     0 - 12 MPL U/mL <9   Anticardiolipin Ab,IgA,Qn     0 - 11 APL U/mL <9  Beta-2 Glycoprotein I Ab, IgG     0 - 20 GPI IgG units <9  Beta-2 Glyco 1 IgA     0 - 25 GPI IgA units <9  Beta-2 Glyco 1 IgM     0 - 32 GPI IgM units <9   Lupus anticoagulant panel      No reference range information available      Comments: No lupus anticoagulant was detected   RADIOGRAPHIC STUDIES: I have personally reviewed the radiological images as listed and agreed with the findings in the report. No results found.  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 This is thought to be predominantly due to mechanical compression from a large fibroid uterus measuring about 30 weeks' size. Mechanical thrombectomy/ catheter directed thrombolysis was attempted in the hospital and unsuccessful. Patient has been on Lovenox and then Xarelto since around mid November 2016 and notes that her lower extremity swelling is much improved . She had a limited thrombophilia testing which was negative for factor V Leiden mutation and prothrombin gene mutation . Anticardiolipin antibodies and beta 2 glycoprotein antibodies were negative . Noted to have presence of a transient lupus anticoagulant antibody .  patient has had her planned hysterectomy with Dr. Denman George and had resolution of her lower extremity swelling.  #2 lupus anticoagulant antibody positivity in 12/2014. Repeat lupus anticoagulant drawn  05/16/2015 was negative suggesting that this is likely not a true ongoing risk for thrombosis and was a transient positive antibody.  Plan - [place] ext veins in 07/2015--showed some burden of chronic DVT . It was reviewed by vascular surgery and no acute or progressive DVT was noted . -Patient was seen by Dr. Scot Dock and given her lack of symptoms a decision was made to not pursue a venogram to look for any venous stenosis . -She was recommended using compression  socks . -Continue using compression socks due to high risk of post-thrombotic syndrome. -She will complete her last month of Xarelto to complete a total of 12 months of treatment . -she will subsequently switched to a daily baby aspirin for lifelong use. -Continue follow-up with primary care physician. -If worsening symptoms might need a repeat ultrasound and repeat evaluation by vascular surgery.  Plan of care was discussed in details with the patient and she is in agreement with this plan.  Return to care with Dr. Irene Limbo on an as-needed basis   Sullivan Lone MD [place] A[facility]IVMS [facility] Hematology/Oncology Physician [facility]  (Office):       [phone] (Work cell):  [phone] (Fax):           [phone]

## 2016-12-30 IMAGING — MR MR PELVIS WO/W CM
5 of 11 series · 18 of 48 positions shown · IV contrast (multihance)
Comparison: None.

CLINICAL DATA: Uterine fibroids. Previous myomectomy. Evaluate for
treatment planning.

EXAM:
MRI PELVIS WITHOUT AND WITH CONTRAST
TECHNIQUE: Multiplanar multisequence MR imaging of the pelvis was performed
both before and after administration of intravenous contrast.
CONTRAST:  14mL MULTIHANCE GADOBENATE DIMEGLUMINE 529 MG/ML IV SOLN

[Series 3: T2 · coronal · 6.0mm · 0.78mm/px · 3 of 32 slices shown (1 of 4)]
[im 1/32]
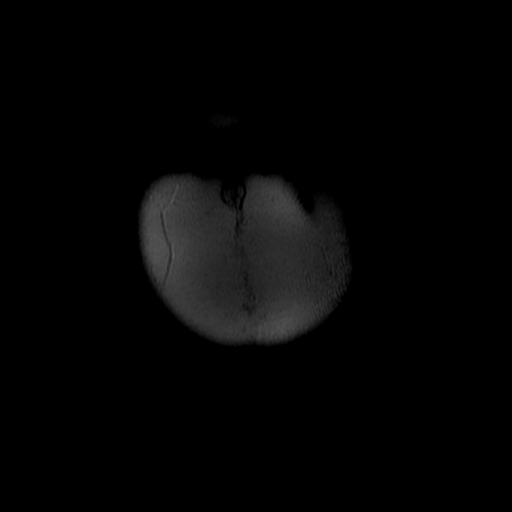
[im 16/32]
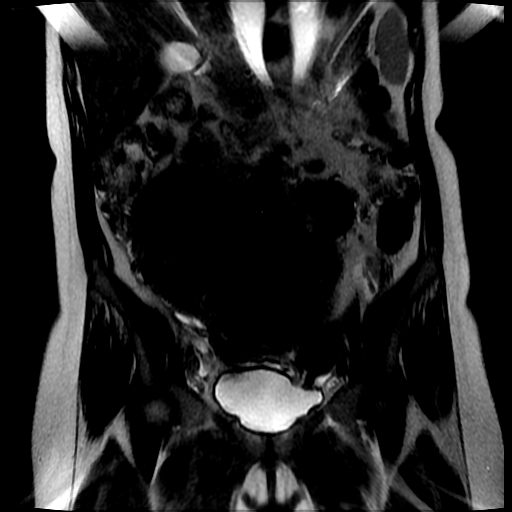
[im 32/32]
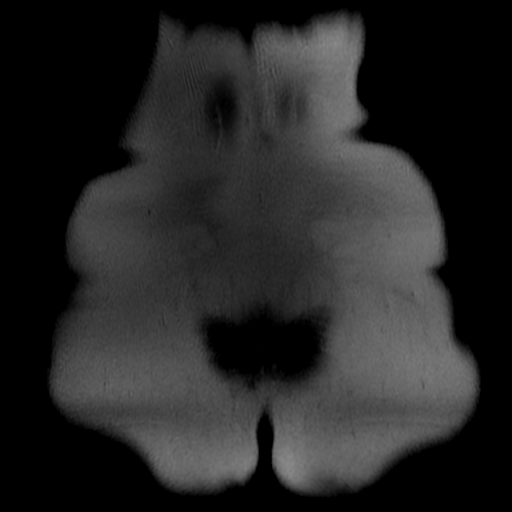

[Series 6: T2 · coronal · 5.0mm · 0.62mm/px · 4 of 36 slices shown (2 of 4)]
[im 1/36]
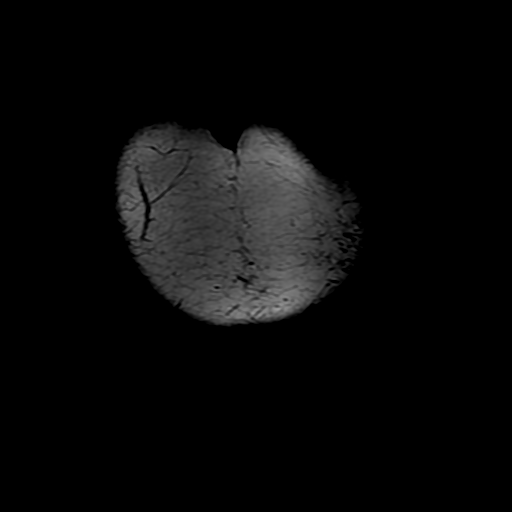
[im 12/36]
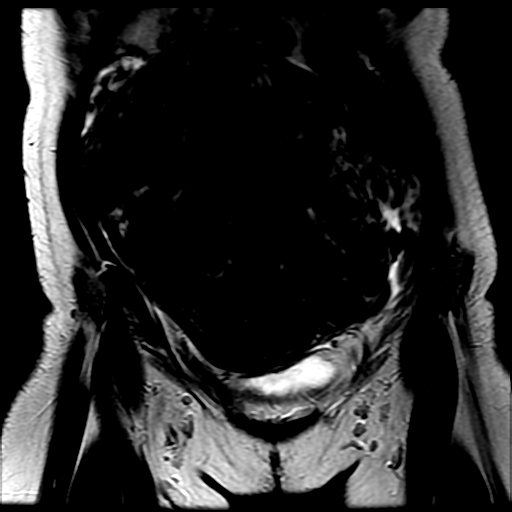
[im 24/36]
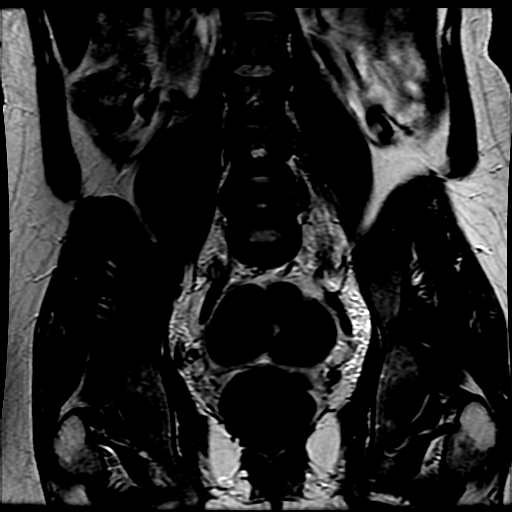
[im 36/36]
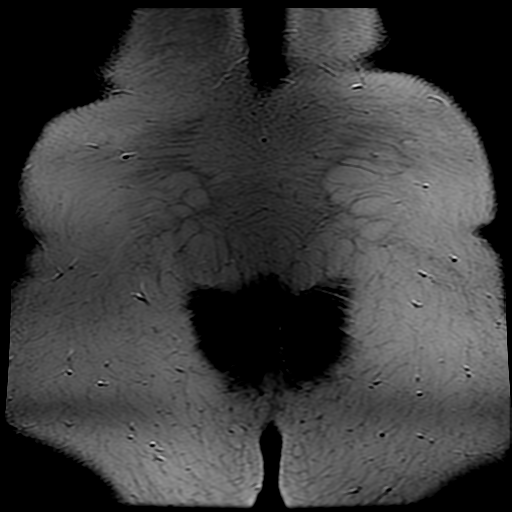

[Series 7: T2 · axial · 5.0mm · 0.55mm/px · z∈[-149,+121]mm · 5 of 46 slices shown (3 of 4)]
[im 1/46]
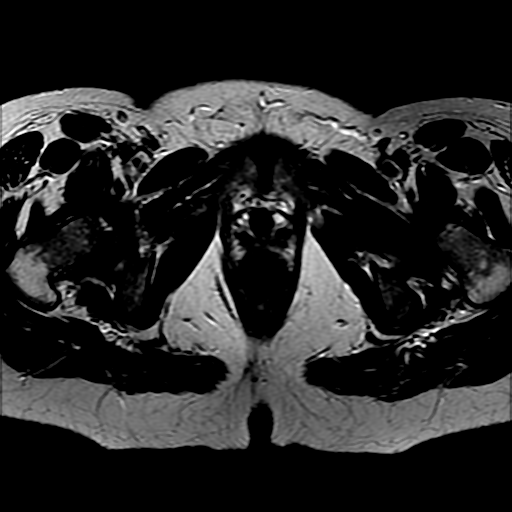
[im 12/46]
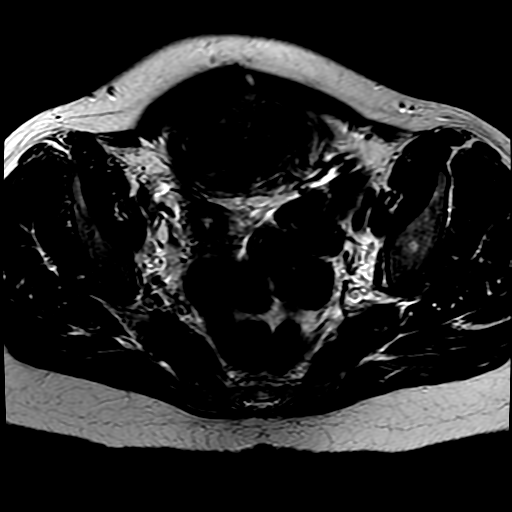
[im 23/46]
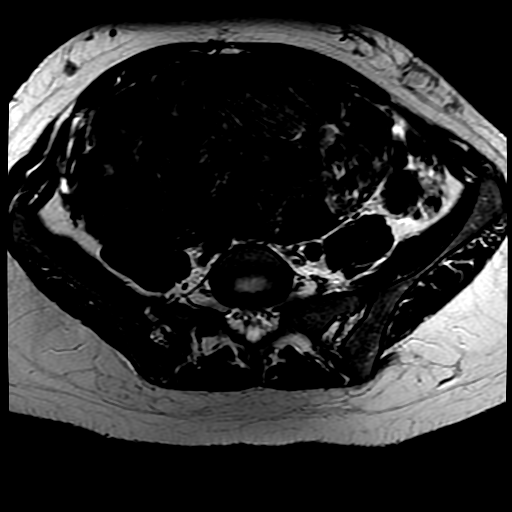
[im 34/46]
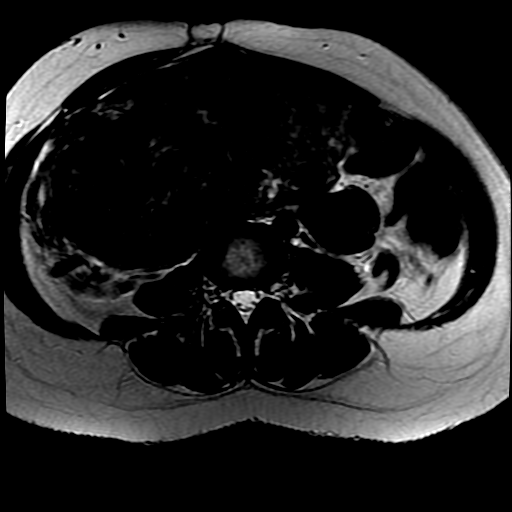
[im 46/46]
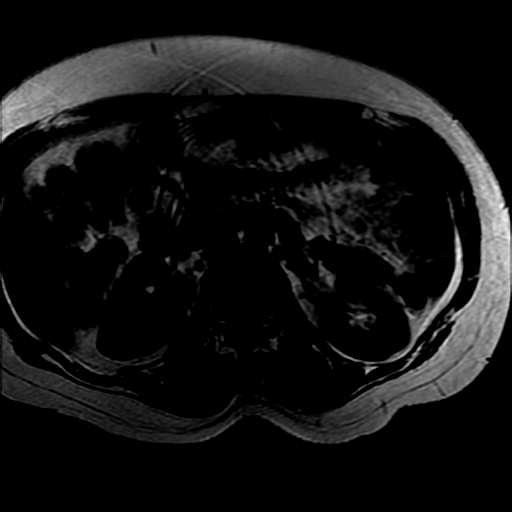

[Series 8: T2 · sagittal · 5.0mm · 0.55mm/px · 4 of 40 slices shown (4 of 4)]
[im 1/40]
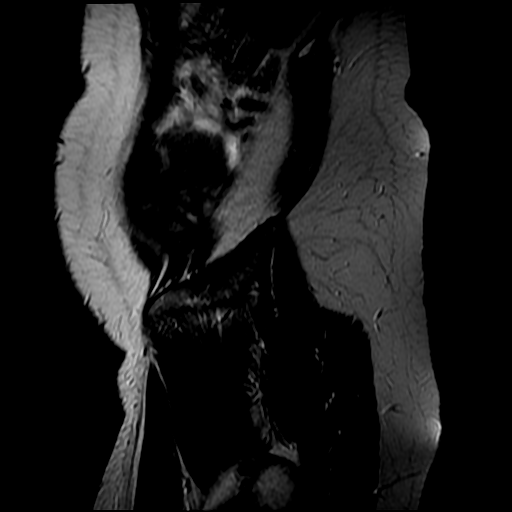
[im 14/40]
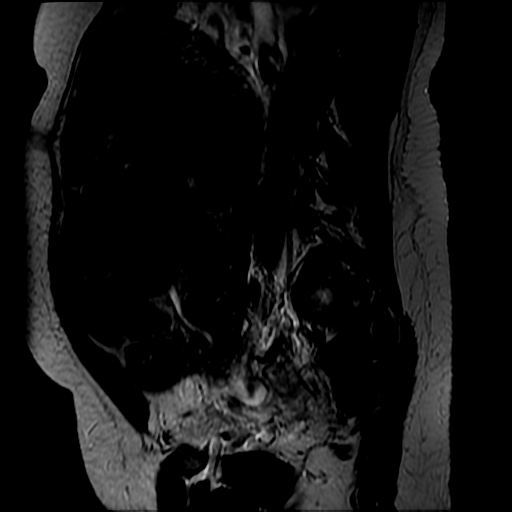
[im 27/40]
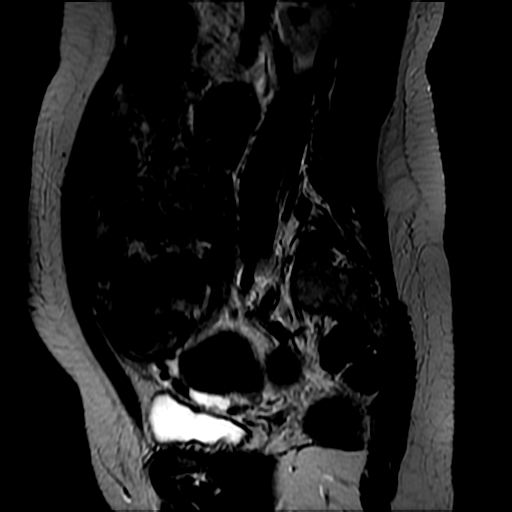
[im 40/40]
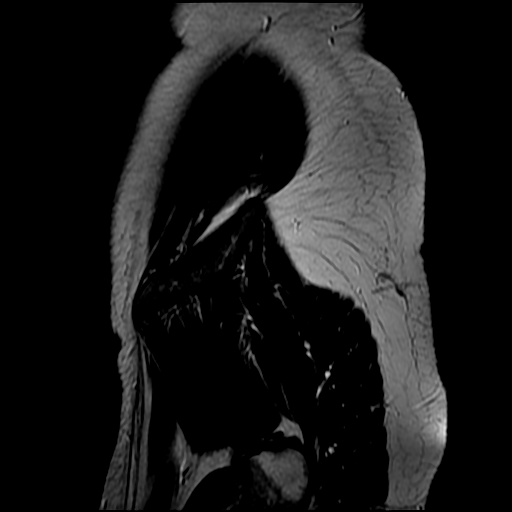

[Series 9: T2 fat-sat · axial · 5.0mm · 0.55mm/px · z∈[-149,-83]mm · 2 of 46 slices shown]
[im 1/46]
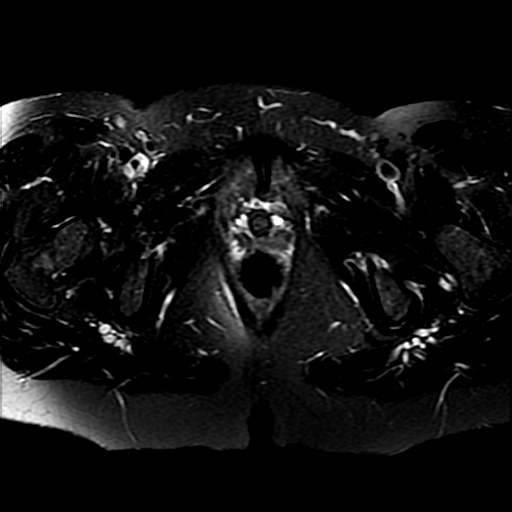
[im 12/46]
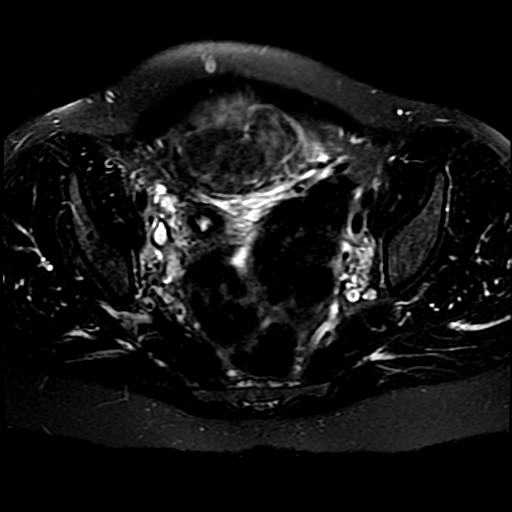

[18 of 48 positions shown; findings below may reference images not displayed]

FINDINGS: Uterus: Markedly enlarged with extension into the mid abdomen.
Measures 24.0 x 10.7 by 18.6 cm. Estimated volume = 2,388 cc

Fibroids: A single large fibroid or conglomeration of fibroids is
seen involving the entire uterine corpus and fundus which measures
21.5 x 10.5 x 18.8 cm. Three distinct smaller subserosal fibroids
are seen in the left uterine fundus, which range in size from 2.6 cm
to 5.5 cm in maximum diameter. The subserosal fibroids have a
relatively broad-based attachments.

Intracavitary fibroids:  None.

Pedunculated fibroids: None.

Fibroid contrast enhancement: All fibroids show diffuse contrast
enhancement, without significant internal
degeneration/devascularization.

Endometrium:  Thin and unremarkable.

Cervix:  Normal appearance.

Right ovary:  Normal appearance.  No adnexal mass identified.

Left ovary:  Normal appearance.  No adnexal mass identified.

Free fluid:  None.

Other: Mild right renal hydronephrosis and proximal ureterectasis
due to ureteral compression by myomatous uterus. Several tiny sub-cm
filling defects are also seen in the gallbladder some which are
along the nondependent wall. These are suspicious for gallbladder
polyps, although some tiny gallstones may be present. No signs of
acute cholecystitis identified.
IMPRESSION: Markedly enlarged myomatous uterus, as described above, with
dominant fibroid measuring 21.5 cm. No intracavitary or pedunculated
fibroids identified.

Normal appearance of both ovaries.  No adnexal mass identified.

Mild right hydronephrosis and proximal ureterectasis due to ureteral
compression by enlarged uterus.

Tiny gallbladder polyps suspected although tiny gallstones may also
be present. Consider abdominal ultrasound for further evaluation if
clinically warranted.
